# Patient Record
Sex: Female | Born: 1970 | Race: Black or African American | Hispanic: No | Marital: Married | Smoking: Never smoker
Health system: Southern US, Community
[De-identification: ages and names within clinical notes are randomized; demographics above are authoritative.]

---

## 2002-05-20 ENCOUNTER — Other Ambulatory Visit: Admission: RE | Admit: 2002-05-20 | Discharge: 2002-05-20 | Payer: Self-pay | Admitting: *Deleted

## 2002-09-13 ENCOUNTER — Inpatient Hospital Stay (HOSPITAL_COMMUNITY): Admission: AD | Admit: 2002-09-13 | Discharge: 2002-09-13 | Payer: Self-pay | Admitting: Gynecology

## 2002-10-29 ENCOUNTER — Encounter: Payer: Self-pay | Admitting: Gynecology

## 2002-10-29 ENCOUNTER — Inpatient Hospital Stay (HOSPITAL_COMMUNITY): Admission: AD | Admit: 2002-10-29 | Discharge: 2002-11-04 | Payer: Self-pay | Admitting: Gynecology

## 2002-10-31 ENCOUNTER — Encounter (INDEPENDENT_AMBULATORY_CARE_PROVIDER_SITE_OTHER): Payer: Self-pay | Admitting: Specialist

## 2002-11-05 ENCOUNTER — Encounter: Admission: RE | Admit: 2002-11-05 | Discharge: 2002-12-05 | Payer: Self-pay | Admitting: Gynecology

## 2002-12-10 ENCOUNTER — Other Ambulatory Visit: Admission: RE | Admit: 2002-12-10 | Discharge: 2002-12-10 | Payer: Self-pay | Admitting: Gynecology

## 2003-01-05 ENCOUNTER — Encounter: Admission: RE | Admit: 2003-01-05 | Discharge: 2003-02-04 | Payer: Self-pay | Admitting: Gynecology

## 2003-12-31 ENCOUNTER — Other Ambulatory Visit: Admission: RE | Admit: 2003-12-31 | Discharge: 2003-12-31 | Payer: Self-pay | Admitting: Gynecology

## 2011-01-03 ENCOUNTER — Emergency Department (HOSPITAL_BASED_OUTPATIENT_CLINIC_OR_DEPARTMENT_OTHER)
Admission: EM | Admit: 2011-01-03 | Discharge: 2011-01-04 | Payer: Self-pay | Source: Home / Self Care | Admitting: Emergency Medicine

## 2011-05-06 NOTE — Discharge Summary (Signed)
   NAMEBROOKELLE, PELLICANE                 ACCOUNT NO.:  [id number]   MEDICAL RECORD NO.:  [id number]                   PATIENT TYPE:  INP   LOCATION:  9133                                 [facility]ACILITY:  [facility]   PHYSICIAN:  Timothy P. Fontaine, M.D.           DATE OF BIRTH:  May 07, 1971   DATE OF ADMISSION:  10/29/2002  DATE OF DISCHARGE:  11/04/2002                                 DISCHARGE SUMMARY   DISCHARGE DIAGNOSES:  1. Intrauterine pregnancy 34 weeks.  2. Clinical preeclampsia.  3. Nonreassuring fetal heart rate tracing.   PROCEDURE:  Primary low cervical transverse cesarean section with delivery  of viable infant.   HISTORY OF PRESENT ILLNESS:  The patient is a 40 year old gravida 3, para 0-  0-2-0 with an LMP of March 08, 2002, [facility] December 13, 2002.  Prenatal risk  factors include history of migraines.   LABORATORIES:  Blood type A+.  Antibody screen negative.  RPR, HBSAG, HIV  nonreactive.  Sickle cell negative.   HOSPITAL COURSE:  The patient was admitted on October 29, 2002 with  elevated blood pressures and a 24 pound weight gain within two weeks.  The  patient was totally asymptomatic.  Had normal PIH panel, normal ultrasound.  She was admitted for observation, 24-hour urine collection.  Induction was  initiated on October 31, 2002 secondary to the suspicion of subclinical  preeclampsia secondary to a nonreassuring fetal heart rate tracing.  Delivery was accomplished by low cervical transverse cesarean section by Dr.  Lily Peer, assisted by Dr. Gwendalyn Ege.  Findings included a viable female  infant, Apgars 8 and 9, weight 4 pounds 15 ounces.  Arterial cord pH 7.26.  Postoperative/postpartum patient remained afebrile.  Had no difficulty  voiding.  Was able to be discharge on her third postoperative day.  CBC:  Hematocrit 26.2, hemoglobin 9.1, WBC 16, platelets 251,000.   DISPOSITION:  Follow up in six weeks.  Continue prenatal vitamins and iron.  Tylox for  pain.     Elwyn Lade . Hancock, N.P.                Timothy P. Audie Box, M.D.    MKH/MEDQ  D:  12/02/2002  T:  12/02/2002  Job:  [id number]

## 2011-05-06 NOTE — H&P (Signed)
[name], Wilson                 ACCOUNT NO.:  [id number]   MEDICAL RECORD NO.:  [id number]                 PATIENT TYPE:  MAT   LOCATION:  MATC                                 [facility]  [facility]   PHYSICIAN:  Juan H. Lily Peer, M.D.             DATE OF BIRTH:  07-Aug-1971   DATE OF ADMISSION:  10/29/2002  DATE OF DISCHARGE:                                HISTORY & PHYSICAL   CHIEF COMPLAINT:  1. Elevated blood pressure.  2. Increased weight gain.  3. Proteinuria.   HISTORY OF PRESENT ILLNESS:  The patient is a 40 year old gravida 3, para 2,  AB 2, with a last menstrual period March 08, 2002, estimated date of  confinement December 13, 2002.  The patient is currently at 33-4/7 weeks'  gestation, had presented to the office this afternoon for routine prenatal  visit and was noted to have elevated blood pressures and followup blood  pressure was 150/100, 152/102 sitting, and laterally was 130/70.  Two weeks  prior her weight was 256 pounds and on today's visit, her weight had  increased to 280 pounds.  She had 2+ proteinuria in her urine and she had 2+  pitting edema.  She denied any right upper quadrant pain, any visual  disturbances, or any headache, and she was only in the office today for her  routine prenatal visit.  Review of her record indicated that she has been  normotensive throughout her pregnancy and essentially has had an uneventful  prenatal course with the exception she has had in the past a history of  migraine headaches, but no headaches were reported today.  She was sent over  to [facility] to commence evaluation and she had the following labs  done:  Her comprehensive metabolic panel was normal with normal pregnancy  parameters.  Her LDH was within the normal range.  Uric acid was 5.9, normal  range.  Her CBC:  Hemoglobin and hematocrit 11.1, hematocrit 32.1, platelet  count 255,000.  An ultrasound was performed which demonstrated a viable  pregnancy in the vertex presentation with a grade 1 placenta.  No evidence  of a previa in the vertex presentation.  AFI was normal and estimated fetal  weight was in the 75th to 90th percentile, average of 2416 to 2657 g.  The  cervix was long at 3.1 cm and closed.  The patient was placed back on the  monitor.  She had a reactive fetal heart rate tracing.  Her blood pressures  were repeated and were as follows:  146/83, 150/90, 158/103, 153/97, 146/96,  and most recent one was 156/80 with a manual cuff.  There was quite a  difference in variation between the manual cuff and the Dinamap.  The  Dinamap on the same reading had reported 154/97.   PAST MEDICAL HISTORY:  1. No history of migraine headaches.  2. She had elective termination in 1991 and 1993.  No other medical problems     reported.  ALLERGIES:  She denies any allergies.   REVIEW OF SYSTEMS:  See [place] form.   PHYSICAL EXAMINATION:  VITAL SIGNS:  As described above.  HEENT:  Unremarkable.  NECK:  Supple.  Trachea midline.  No carotid bruits, no thyromegaly.  LUNGS:  Clear to auscultation without any rhonchi or wheezes.  HEART:  Regular rate and rhythm, no murmurs or gallops.  BREASTS:  Exam was not done.  ABDOMEN:  Gravid uterus, fundal height 34 cm, vertex presentation by Thayer Ohm  maneuver, confirmed by ultrasound.  PELVIC:  Exam was not done due to the fact that she had had an ultrasound  before the pelvic exam was initiated and demonstrated cervix on ultrasound  was 2.1 cm and closed.  EXTREMITIES:  2+ pitting edema.  DTR was 1+.   PRENATAL LABORATORY:  A positive blood type, negative antibody screen.  Sickle cell trait was negative.  VDRL was nonreactive.  Hepatitis B surface  antigen and HIV were negative.  Maternal serum alpha-fetoprotein were  normal.  Diabetes screen was normal.   ASSESSMENT:  A 40 year old gravida 3, para 0, AB 2 at 33-4/7 weeks'  gestation who was found on routine visit to have elevated  blood pressures  and had gained 24 pounds in the previous two weeks, was totally  asymptomatic.  Denied any right upper quadrant pain, any visual  disturbances, or any headache.  The patient had normal TIH panel, normal  ultrasound.  Blood pressure seems to have subsided and improved on lateral  decubitus position, but a 24 hour urine collection was initiated in the  hospital upon admission.  The patient, being 33-1/2 weeks, will receive  betamethasone 12.5 mg IM now to be repeated in 12 hours.  In the event that  her clinical picture would show evidence of deterioration such as elevated  blood pressure or abnormal labs when they are repeated in the morning, we  may need to proceed with premature delivery.  I have had a lengthy  discussion with the husband and wife, stating that if clinical condition  would deteriorate, that we may need to intervene with a cesarean section,  due to the fact that she has an unfavorable cervix this early in her  pregnancy.  The goal is to try to get two doses of the betamethasone within  12 hours and then proceed with delivery if condition deteriorates.  If not  will continue to observe closely and try to proceed with conservative  measures.  The use of hypertensive agents will be addressed if her blood  pressures do not resolve with the above mentioned measures.  Will keep her  IV line at t.k.o. and minimize fluids until she becomes n.p.o. after  midnight and we may open it up to 100 cc per hour.  The patient and her  husband are fully aware of issues concerned with hypertension, such as  preeclampsia, which could lead to stroke, abruptio placenta, fetal death, or  premature delivery.  Will continue to monitor her very closely in the  hospital and take the above mentioned actions if situations present  themselves or change.   PLAN:  As per assessment above.                                              Juan H. Lily Peer, M.D.    JHF/MEDQ  D:   10/29/2002  T:  10/29/2002  Job:  [id number]

## 2011-05-06 NOTE — Op Note (Signed)
Rachel Wilson, Rachel Wilson                 ACCOUNT NO.:  [id number]   MEDICAL RECORD NO.:  [id number]                   PATIENT TYPE:  INP   LOCATION:  9180                                 [facility]ILITY:  [facility]   PHYSICIAN:  Juan H. Lily Peer, M.D.             DATE OF BIRTH:  [birth year]   DATE OF PROCEDURE:  10/29/2002  DATE OF DISCHARGE:                                 OPERATIVE REPORT   PREOPERATIVE DIAGNOSES:  1. Thirty-four week intrauterine pregnancy.  2. Subclinical preeclampsia.  3. Nonreassuring fetal heart rate tracing.   POSTOPERATIVE DIAGNOSES:  1. Thirty-four week intrauterine pregnancy.  2. Subclinical preeclampsia.  3. Nonreassuring fetal heart rate tracing.   PROCEDURES:  Primary low uterine segment transverse cesarean section.   SURGEON:  Juan H. Lily Peer, M.D.   ASSISTANT:  Ronda Fairly. Galen Daft, M.D.   ANESTHESIA:  Epidural.   ESTIMATED BLOOD LOSS:  800 cc.   FLUIDS REPLACED:  800 cc of lactated Ringer's.   URINE OUTPUT:  125 cc and clear.   INDICATION FOR PROCEDURE:  A 40 year old gravida 3, para 2, AB 2 at 63  weeks' gestation, with subclinical preeclampsia.  Patient with 19-pound  weight gain in a 24-hour period, complaining of headaches and 1.5 g  proteinuria in a 24-hour urine collection.   FINDINGS:  Viable female infant, Apgars of 8 and 9, with a weight of 4  pounds 15 ounces, arterial cord pH of 7.26.   DESCRIPTION OF PROCEDURE:  After the patient was adequately counseled, she  was taken to the operating room, where she underwent a successful epidural  placement.  Her abdomen was prepped and draped in the usual sterile fashion.  A Foley catheter had previously been inserted in the labor and delivery  suite.  After the drapes were in place, a Pfannenstiel skin incision was  made 2 cm above the symphysis pubis, and the incision was carried down  through the skin and subcutaneous tissue down to the rectus fascia,  whereupon a midline nick was made.   The fascia was incised in a transverse  fashion, and the peritoneal cavity was entered cautiously and a bladder flap  was established.  The lower uterine segment was incised in a transverse  fashion.  Clear amniotic fluid was present.  The newborn was delivered  without any difficulty.  The nasopharyngeal area was bulb-suctioned.  The  cord was doubly clamped and excised and the newborn was shown to the parents  and passed off immediately to the pediatricians who were in attendance in  view of the above-mentioned parameters.  After the appropriate cord blood  was obtained, Pitocin was initiated.  The patient received a gram of  Cefotan.  The placenta was exteriorized and submitted to pathology for  histologic evaluation.  The intrauterine cavity was wiped free of any  products of conception, and the lower uterine segment incision was closed  with a running locking stitch of 0 Vicryl suture.  A normal maternal  pelvis  with normal tubes and ovaries.  The uterus was placed back into the pelvic  cavity and the pelvic cavity was copiously irrigated with normal saline  solution.  After inspection and ascertaining adequate hemostasis, closure  was commenced, and the needle count and sponge count were correct.  The  visceral peritoneum was not reapproximated.  The rectus fascia was closed  with a running stitch of 0 Vicryl suture, the subcutaneous bleeders were  Bovie cauterized, and the skin was reapproximated with skin clips, followed  by placement of Xeroform gauze and 4 x 4 dressing.  The patient was  transferred to the recovery room with stable vital signs.                                                Juan H. Lily Peer, M.D.    JHF/MEDQ  D:  11/01/2002  T:  11/01/2002  Job:  [id number]

## 2020-08-30 ENCOUNTER — Other Ambulatory Visit: Payer: Self-pay

## 2020-08-30 ENCOUNTER — Emergency Department (HOSPITAL_BASED_OUTPATIENT_CLINIC_OR_DEPARTMENT_OTHER): Payer: Commercial Managed Care - PPO

## 2020-08-30 ENCOUNTER — Inpatient Hospital Stay (HOSPITAL_BASED_OUTPATIENT_CLINIC_OR_DEPARTMENT_OTHER)
Admission: EM | Admit: 2020-08-30 | Discharge: 2020-09-03 | DRG: 177 | Disposition: A | Discharge status: Home / Self Care | Payer: Commercial Managed Care - PPO | Attending: Internal Medicine | Admitting: Internal Medicine

## 2020-08-30 ENCOUNTER — Encounter (HOSPITAL_BASED_OUTPATIENT_CLINIC_OR_DEPARTMENT_OTHER): Payer: Self-pay | Admitting: *Deleted

## 2020-08-30 DIAGNOSIS — J9600 Acute respiratory failure, unspecified whether with hypoxia or hypercapnia: Secondary | ICD-10-CM | POA: Diagnosis present

## 2020-08-30 DIAGNOSIS — E876 Hypokalemia: Secondary | ICD-10-CM | POA: Diagnosis present

## 2020-08-30 DIAGNOSIS — U071 COVID-19: Secondary | ICD-10-CM | POA: Diagnosis not present

## 2020-08-30 DIAGNOSIS — R0902 Hypoxemia: Secondary | ICD-10-CM | POA: Diagnosis not present

## 2020-08-30 DIAGNOSIS — G43909 Migraine, unspecified, not intractable, without status migrainosus: Secondary | ICD-10-CM

## 2020-08-30 DIAGNOSIS — J1282 Pneumonia due to coronavirus disease 2019: Secondary | ICD-10-CM | POA: Diagnosis present

## 2020-08-30 DIAGNOSIS — E669 Obesity, unspecified: Secondary | ICD-10-CM | POA: Diagnosis present

## 2020-08-30 DIAGNOSIS — J9601 Acute respiratory failure with hypoxia: Secondary | ICD-10-CM | POA: Diagnosis present

## 2020-08-30 DIAGNOSIS — Z23 Encounter for immunization: Secondary | ICD-10-CM

## 2020-08-30 DIAGNOSIS — Z79899 Other long term (current) drug therapy: Secondary | ICD-10-CM

## 2020-08-30 DIAGNOSIS — Z6836 Body mass index (BMI) 36.0-36.9, adult: Secondary | ICD-10-CM

## 2020-08-30 LAB — FERRITIN: Ferritin: 39 ng/mL (ref 11–307)

## 2020-08-30 LAB — COMPREHENSIVE METABOLIC PANEL
ALT: 38 U/L (ref 0–44)
AST: 46 U/L — ABNORMAL HIGH (ref 15–41)
Albumin: 2.8 g/dL — ABNORMAL LOW (ref 3.5–5.0)
Alkaline Phosphatase: 60 U/L (ref 38–126)
Anion gap: 14 (ref 5–15)
BUN: 6 mg/dL (ref 6–20)
CO2: 29 mmol/L (ref 22–32)
Calcium: 8.5 mg/dL — ABNORMAL LOW (ref 8.9–10.3)
Chloride: 93 mmol/L — ABNORMAL LOW (ref 98–111)
Creatinine, Ser: 0.78 mg/dL (ref 0.44–1.00)
GFR calc Af Amer: >60 mL/min (ref >60)
GFR calc non Af Amer: >60 mL/min (ref >60)
Glucose, Bld: 115 mg/dL — ABNORMAL HIGH (ref 70–99)
Potassium: 3.3 mmol/L — ABNORMAL LOW (ref 3.5–5.1)
Sodium: 136 mmol/L (ref 135–145)
Total Bilirubin: 0.9 mg/dL (ref 0.3–1.2)
Total Protein: 7.7 g/dL (ref 6.5–8.1)

## 2020-08-30 LAB — PREGNANCY, URINE: Preg Test, Ur: NEGATIVE

## 2020-08-30 LAB — SARS CORONAVIRUS 2 BY RT PCR (HOSPITAL ORDER, PERFORMED IN ~~LOC~~ HOSPITAL LAB): SARS Coronavirus 2: POSITIVE — AB

## 2020-08-30 LAB — CBC WITH DIFFERENTIAL/PLATELET
Abs Immature Granulocytes: 0.17 10*3/uL — ABNORMAL HIGH (ref 0.00–0.07)
Basophils Absolute: 0 10*3/uL (ref 0.0–0.1)
Basophils Relative: 0 %
Eosinophils Absolute: 0 10*3/uL (ref 0.0–0.5)
Eosinophils Relative: 0 %
HCT: 32.3 % — ABNORMAL LOW (ref 36.0–46.0)
Hemoglobin: 9.8 g/dL — ABNORMAL LOW (ref 12.0–15.0)
Immature Granulocytes: 2 %
Lymphocytes Relative: 11 %
Lymphs Abs: 0.8 10*3/uL (ref 0.7–4.0)
MCH: 25.3 pg — ABNORMAL LOW (ref 26.0–34.0)
MCHC: 30.3 g/dL (ref 30.0–36.0)
MCV: 83.2 fL (ref 80.0–100.0)
Monocytes Absolute: 0.3 10*3/uL (ref 0.1–1.0)
Monocytes Relative: 4 %
Neutro Abs: 6.6 10*3/uL (ref 1.7–7.7)
Neutrophils Relative %: 83 %
Platelets: 419 10*3/uL — ABNORMAL HIGH (ref 150–400)
RBC: 3.88 MIL/uL (ref 3.87–5.11)
RDW: 16.5 % — ABNORMAL HIGH (ref 11.5–15.5)
WBC: 7.9 10*3/uL (ref 4.0–10.5)
nRBC: 1.5 % — ABNORMAL HIGH (ref 0.0–0.2)

## 2020-08-30 LAB — PROCALCITONIN: Procalcitonin: 0.13 ng/mL

## 2020-08-30 LAB — D-DIMER, QUANTITATIVE: D-Dimer, Quant: 12.26 ug/mL-FEU — ABNORMAL HIGH (ref 0.00–0.50)

## 2020-08-30 LAB — C-REACTIVE PROTEIN: CRP: 37.1 mg/dL — ABNORMAL HIGH (ref <1.0)

## 2020-08-30 LAB — LACTATE DEHYDROGENASE: LDH: 482 U/L — ABNORMAL HIGH (ref 98–192)

## 2020-08-30 LAB — LACTIC ACID, PLASMA: Lactic Acid, Venous: 1.8 mmol/L (ref 0.5–1.9)

## 2020-08-30 LAB — FIBRINOGEN: Fibrinogen: >800 mg/dL — ABNORMAL HIGH (ref 210–475)

## 2020-08-30 MED ORDER — SODIUM CHLORIDE 0.9 % IV SOLN
100.0000 mg | INTRAVENOUS | Status: AC
  Administered 2020-08-30 (×2): 100 mg via INTRAVENOUS
  Filled 2020-08-30 (×2): qty 20

## 2020-08-30 MED ORDER — ACETAMINOPHEN 325 MG PO TABS
650.0000 mg | ORAL_TABLET | Freq: Once | ORAL | Status: AC | PRN
  Administered 2020-08-30: 650 mg via ORAL
  Filled 2020-08-30: qty 2

## 2020-08-30 MED ORDER — DEXAMETHASONE SODIUM PHOSPHATE 10 MG/ML IJ SOLN
6.0000 mg | Freq: Once | INTRAMUSCULAR | Status: AC
  Administered 2020-08-30: 6 mg via INTRAVENOUS
  Filled 2020-08-30: qty 1

## 2020-08-30 MED ORDER — POTASSIUM CHLORIDE CRYS ER 20 MEQ PO TBCR
40.0000 meq | EXTENDED_RELEASE_TABLET | Freq: Once | ORAL | Status: AC
  Administered 2020-08-30: 40 meq via ORAL
  Filled 2020-08-30: qty 2

## 2020-08-30 MED ORDER — ACETAMINOPHEN 325 MG PO TABS
325.0000 mg | ORAL_TABLET | Freq: Once | ORAL | Status: AC
  Administered 2020-08-30: 325 mg via ORAL
  Filled 2020-08-30: qty 1

## 2020-08-30 MED ORDER — SODIUM CHLORIDE 0.9 % IV SOLN
INTRAVENOUS | Status: DC | PRN
  Administered 2020-08-30: 250 mL via INTRAVENOUS

## 2020-08-30 MED ORDER — SODIUM CHLORIDE 0.9 % IV SOLN
100.0000 mg | Freq: Every day | INTRAVENOUS | Status: AC
  Administered 2020-08-31 – 2020-09-03 (×4): 100 mg via INTRAVENOUS
  Filled 2020-08-30 (×4): qty 20

## 2020-08-30 MED ORDER — IOHEXOL 350 MG/ML SOLN
100.0000 mL | Freq: Once | INTRAVENOUS | Status: AC | PRN
  Administered 2020-08-30: 100 mL via INTRAVENOUS

## 2020-08-30 NOTE — ED Provider Notes (Addendum)
[facility] EMERGENCY DEPARTMENT Provider Note   CSN: [id number] Arrival date & time: 08/30/20  1854     History Chief Complaint  Patient presents with  . Covid Positive    Rachel Wilson is a 49 y.o. female.  HPI Patient is a 49 year old female who presents with a multitude of symptoms consistent with COVID-19.  Patient states that she was exposed to someone with COVID-19 on September 6.  Her symptoms started shortly thereafter.  Patient states she tested positive last week.  She has not been vaccinated for COVID-19.  She is experiencing waxing and waning diffuse headaches, fevers, chills, body aches, cough, rhinorrhea, sore throat.  She reports associated worsening shortness of breath.  No chest pain.  Denies any abdominal pain, nausea, vomiting, diarrhea.  No syncope.    History reviewed. No pertinent past medical history.  There are no problems to display for this patient.   History reviewed. No pertinent surgical history.   OB History   No obstetric history on file.     History reviewed. No pertinent family history.  Social History   Tobacco Use  . Smoking status: Never Smoker  . Smokeless tobacco: Never Used  Substance Use Topics  . Alcohol use: Never  . Drug use: Never    Home Medications Prior to Admission medications   Not on File    Allergies    Patient has no known allergies.  Review of Systems   Review of Systems  All other systems reviewed and are negative. Ten systems reviewed and are negative for acute change, except as noted in the HPI.   Physical Exam Updated Vital Signs BP (!) 134/102   Pulse (!) 124   Temp (!) 101.1 F (38.4 C) (Oral)   Resp 18   Ht 5\' 7"  (1.702 m)   Wt 104.3 kg   LMP 07/26/2020   SpO2 (!) 88%   BMI 36.02 kg/m   Physical Exam Vitals and nursing note reviewed.  Constitutional:      General: She is in acute distress.     Appearance: Normal appearance. She is obese. She is ill-appearing. She is not  toxic-appearing or diaphoretic.  HENT:     Head: Normocephalic and atraumatic.     Right Ear: External ear normal.     Left Ear: External ear normal.     Nose: Nose normal.     Mouth/Throat:     Mouth: Mucous membranes are moist.     Pharynx: Oropharynx is clear. No oropharyngeal exudate or posterior oropharyngeal erythema.  Eyes:     Extraocular Movements: Extraocular movements intact.  Cardiovascular:     Rate and Rhythm: Regular rhythm. Tachycardia present.     Pulses: Normal pulses.     Heart sounds: Normal heart sounds. No murmur heard.  No friction rub. No gallop.      Comments: No murmurs, rubs, gallops.  Patient is tachycardic around 130 bpm. Pulmonary:     Effort: Pulmonary effort is normal. No respiratory distress.     Breath sounds: No stridor. Rales present. No wheezing or rhonchi.     Comments: Crackles noted in the bilateral lung bases.  Oxygen saturations dropped to 82% on room air.  Patient was placed on 2 L of O2 via nasal cannula with saturations in the low to mid 90s. Chest:     Chest wall: No tenderness.  Abdominal:     General: Abdomen is flat.     Palpations: Abdomen is soft.  Tenderness: There is no abdominal tenderness.  Musculoskeletal:        General: Normal range of motion.     Cervical back: Normal range of motion and neck supple. No tenderness.  Skin:    General: Skin is warm and dry.  Neurological:     General: No focal deficit present.     Mental Status: She is alert and oriented to person, place, and time.  Psychiatric:        Mood and Affect: Mood normal.        Behavior: Behavior normal.    ED Results / Procedures / Treatments   Labs (all labs ordered are listed, but only abnormal results are displayed) Labs Reviewed  SARS CORONAVIRUS 2 BY RT PCR (HOSPITAL ORDER, PERFORMED IN [facility]) - Abnormal; Notable for the following components:      Result Value   SARS Coronavirus 2 POSITIVE (*)    All other components within  normal limits  CBC WITH DIFFERENTIAL/PLATELET - Abnormal; Notable for the following components:   Hemoglobin 9.8 (*)    HCT 32.3 (*)    MCH 25.3 (*)    RDW 16.5 (*)    Platelets 419 (*)    nRBC 1.5 (*)    Abs Immature Granulocytes 0.17 (*)    All other components within normal limits  COMPREHENSIVE METABOLIC PANEL - Abnormal; Notable for the following components:   Potassium 3.3 (*)    Chloride 93 (*)    Glucose, Bld 115 (*)    Calcium 8.5 (*)    Albumin 2.8 (*)    AST 46 (*)    All other components within normal limits  D-DIMER, QUANTITATIVE (NOT AT [facility]) - Abnormal; Notable for the following components:   D-Dimer, Quant 12.26 (*)    All other components within normal limits  LACTATE DEHYDROGENASE - Abnormal; Notable for the following components:   LDH 482 (*)    All other components within normal limits  FIBRINOGEN - Abnormal; Notable for the following components:   Fibrinogen >800 (*)    All other components within normal limits  C-REACTIVE PROTEIN - Abnormal; Notable for the following components:   CRP 37.1 (*)    All other components within normal limits  CULTURE, BLOOD (ROUTINE X 2)  CULTURE, BLOOD (ROUTINE X 2)  LACTIC ACID, PLASMA  PROCALCITONIN  FERRITIN  LACTIC ACID, PLASMA  TRIGLYCERIDES  PREGNANCY, URINE   EKG None  Radiology CT Angio Chest PE W and/or Wo Contrast  Result Date: 08/30/2020 CLINICAL DATA:  COVID, short of breath tachycardia EXAM: CT ANGIOGRAPHY CHEST WITH CONTRAST TECHNIQUE: Multidetector CT imaging of the chest was performed using the standard protocol during bolus administration of intravenous contrast. Multiplanar CT image reconstructions and MIPs were obtained to evaluate the vascular anatomy. CONTRAST:  OMNIPAQUE IOHEXOL 350 MG/ML SOLN COMPARISON:  None. FINDINGS: Cardiovascular: Satisfactory opacification of the pulmonary arteries to the segmental level. No evidence of pulmonary embolism. Normal heart size. No pericardial effusion.  Nonaneurysmal aorta. No dissection is seen. Mediastinum/Nodes: Midline trachea. No suspicious thyroid mass. Mild mediastinal adenopathy. Left jugular node measures 11 mm. Prevascular lymph node measuring up to 15 mm. Left precarinal node measures 10 mm. Small hilar nodes. Esophagus within normal limits. Lungs/Pleura: Widespread bilateral ground-glass densities and patchy consolidations. No pleural effusion or pneumothorax Upper Abdomen: No acute abnormality. Musculoskeletal: No chest wall abnormality. No acute or significant osseous findings. Review of the MIP images confirms the above findings. IMPRESSION: 1. Negative for acute  pulmonary embolus or aortic dissection. 2. Widespread bilateral ground-glass densities and patchy consolidations, consistent with bilateral pneumonia and likely related to history of COVID positivity. 3. Mild mediastinal and hilar adenopathy, likely reactive. Electronically Signed   By: Jasmine Pang M.D.   On: 08/30/2020 21:22   DG Chest Port 1 View  Result Date: 08/30/2020 CLINICAL DATA:  COVID positive with shortness of breath. EXAM: PORTABLE CHEST 1 VIEW COMPARISON:  None. FINDINGS: Mild ill-defined multifocal infiltrates are seen within the bilateral lung bases and along the periphery of both lungs. There is no evidence of a pleural effusion or pneumothorax. The heart size and mediastinal contours are within normal limits. The visualized skeletal structures are unremarkable. IMPRESSION: Mild bilateral multifocal infiltrates. Electronically Signed   By: Aram Candela M.D.   On: 08/30/2020 20:27    Procedures .Critical Care Performed by: Placido Sou, PA-C Authorized by: Placido Sou, PA-C   Critical care provider statement:    Critical care time (minutes):  45   Critical care was necessary to treat or prevent imminent or life-threatening deterioration of the following conditions:  Respiratory failure   Critical care was time spent personally by me on the  following activities:  Discussions with consultants, evaluation of patient's response to treatment, examination of patient, ordering and performing treatments and interventions, ordering and review of laboratory studies, ordering and review of radiographic studies, pulse oximetry, re-evaluation of patient's condition, obtaining history from patient or surrogate and review of old charts   (including critical care time)  Medications Ordered in ED Medications  iohexol (OMNIPAQUE) 350 MG/ML injection 100 mL (has no administration in time range)  acetaminophen (TYLENOL) tablet 325 mg (has no administration in time range)  potassium chloride SA (KLOR-CON) CR tablet 40 mEq (has no administration in time range)  acetaminophen (TYLENOL) tablet 650 mg (650 mg Oral Given 08/30/20 1925)    ED Course  I have reviewed the triage vital signs and the nursing notes.  Pertinent labs & imaging results that were available during my care of the patient were reviewed by me and considered in my medical decision making (see chart for details).  Clinical Course as of Aug 31 930  Wynelle Link Aug 30, 2020  2041 Mild bilateral multifocal infiltrates  DG Chest [facility] [LJ]  2052 Hemoglobin(!): 9.8 [LJ]  2052 Platelets(!): 419 [LJ]  2052 Will replete with Klor-Con.  Potassium(!): 3.3 [LJ]  2052 SARS Coronavirus 2(!): POSITIVE [LJ]  2206 Spoke to [facility] hospitalist regarding admission for this patient.  Requested that I put in an order for Remdesivir per pharmacy as well as dexamethasone.  We will do so.   [LJ]    Clinical Course User Index [LJ] Placido Sou, PA-C   MDM Rules/Calculators/A&P                          Pt is a 49 y.o. female that presents with a history, physical exam, and ED Clinical Course as noted above.   Patient presents today due to increasing shortness of breath secondary to a known COVID-19 infection.  Patient once again tested positive for COVID-19.  Patient states she started to  experience her symptoms on September 6.  She has not been vaccinated for COVID-19.  No leukocytosis noted on CBC.  Hemoglobin down to 9.8.  Significantly elevated D-dimer at 12.26.  Given her tachycardia and hypoxia I ordered a PE study.  This was negative for acute pulmonary embolism.  Mildly hypokalemic at  3.3 which was repleted with Klor-Con.  Elevated fibrinogen, LDH, CRP.  She was initially tachycardic near 140 bpm with a fever of 101.1 Fahrenheit.  She was hypoxic down to 82% on room air.  She has been given 975 mg of Tylenol as well as placed on 2 L of O2 via nasal cannula.  O2 saturations are currently fluctuating in the mid 90s.  Tachycardia has improved to 100 to 110 bpm.  Discussed with the hospitalist team for admission.  Hospitalist asked that I start patient on Remdesivir as well as dexamethasone.  These were both ordered.  Note: Portions of this report may have been transcribed using voice recognition software. Every effort was made to ensure accuracy; however, inadvertent computerized transcription errors may be present.   Final Clinical Impression(s) / ED Diagnoses Final diagnoses:  COVID-19  Hypoxia   Rx / DC Orders ED Discharge Orders    None       Placido SouJoldersma, Lorance Pickeral, PA-C 08/30/20 2301    Placido SouJoldersma, Marlaya Turck, PA-C 08/31/20 0932    Placido SouJoldersma, Shunte Senseney, PA-C 08/31/20 0934    Charlynne PanderYao, David Hsienta, MD 08/31/20 [phone]

## 2020-08-30 NOTE — ED Notes (Signed)
Lab called a covid positive patient. [place], [place] aware.

## 2020-08-30 NOTE — ED Triage Notes (Signed)
Pt reports she is covid positive-dx . Reports migraine and cough continues.

## 2020-08-31 DIAGNOSIS — Z79899 Other long term (current) drug therapy: Secondary | ICD-10-CM | POA: Diagnosis not present

## 2020-08-31 DIAGNOSIS — U071 COVID-19: Principal | ICD-10-CM

## 2020-08-31 DIAGNOSIS — Z6836 Body mass index (BMI) 36.0-36.9, adult: Secondary | ICD-10-CM | POA: Diagnosis not present

## 2020-08-31 DIAGNOSIS — J1282 Pneumonia due to coronavirus disease 2019: Secondary | ICD-10-CM | POA: Diagnosis present

## 2020-08-31 DIAGNOSIS — G43009 Migraine without aura, not intractable, without status migrainosus: Secondary | ICD-10-CM | POA: Diagnosis not present

## 2020-08-31 DIAGNOSIS — J9600 Acute respiratory failure, unspecified whether with hypoxia or hypercapnia: Secondary | ICD-10-CM | POA: Diagnosis present

## 2020-08-31 DIAGNOSIS — E876 Hypokalemia: Secondary | ICD-10-CM | POA: Diagnosis present

## 2020-08-31 DIAGNOSIS — R0902 Hypoxemia: Secondary | ICD-10-CM | POA: Diagnosis present

## 2020-08-31 DIAGNOSIS — G43909 Migraine, unspecified, not intractable, without status migrainosus: Secondary | ICD-10-CM

## 2020-08-31 DIAGNOSIS — J9601 Acute respiratory failure with hypoxia: Secondary | ICD-10-CM | POA: Diagnosis present

## 2020-08-31 DIAGNOSIS — E669 Obesity, unspecified: Secondary | ICD-10-CM | POA: Diagnosis present

## 2020-08-31 DIAGNOSIS — Z23 Encounter for immunization: Secondary | ICD-10-CM | POA: Diagnosis not present

## 2020-08-31 LAB — TRIGLYCERIDES: Triglycerides: 158 mg/dL — ABNORMAL HIGH (ref <150)

## 2020-08-31 MED ORDER — HYDROCOD POLST-CPM POLST ER 10-8 MG/5ML PO SUER
5.0000 mL | Freq: Two times a day (BID) | ORAL | Status: DC | PRN
  Administered 2020-08-31 – 2020-09-02 (×2): 5 mL via ORAL
  Filled 2020-08-31 (×2): qty 5

## 2020-08-31 MED ORDER — DEXAMETHASONE SODIUM PHOSPHATE 10 MG/ML IJ SOLN
6.0000 mg | Freq: Once | INTRAMUSCULAR | Status: AC
  Administered 2020-08-31: 6 mg via INTRAVENOUS
  Filled 2020-08-31: qty 1

## 2020-08-31 MED ORDER — ACETAMINOPHEN 325 MG PO TABS
650.0000 mg | ORAL_TABLET | Freq: Four times a day (QID) | ORAL | Status: DC | PRN
  Administered 2020-09-02: 650 mg via ORAL
  Filled 2020-08-31: qty 2

## 2020-08-31 MED ORDER — INFLUENZA VAC SPLIT QUAD 0.5 ML IM SUSY
0.5000 mL | PREFILLED_SYRINGE | INTRAMUSCULAR | Status: DC
  Filled 2020-08-31: qty 0.5

## 2020-08-31 MED ORDER — SUMATRIPTAN SUCCINATE 50 MG PO TABS: 100.0000 mg | ORAL_TABLET | ORAL | Status: DC | PRN

## 2020-08-31 MED ORDER — ASCORBIC ACID 500 MG PO TABS
500.0000 mg | ORAL_TABLET | Freq: Every day | ORAL | Status: DC
  Administered 2020-08-31 – 2020-09-03 (×4): 500 mg via ORAL
  Filled 2020-08-31 (×4): qty 1

## 2020-08-31 MED ORDER — SODIUM CHLORIDE 0.9 % IV SOLN: 250.0000 mL | INTRAVENOUS | Status: DC | PRN

## 2020-08-31 MED ORDER — SODIUM CHLORIDE 0.9 % IV SOLN: 200.0000 mg | Freq: Once | INTRAVENOUS | Status: DC

## 2020-08-31 MED ORDER — SODIUM CHLORIDE 0.9% FLUSH
3.0000 mL | Freq: Two times a day (BID) | INTRAVENOUS | Status: DC
  Administered 2020-08-31 – 2020-09-03 (×6): 3 mL via INTRAVENOUS

## 2020-08-31 MED ORDER — ONDANSETRON HCL 4 MG PO TABS: 4.0000 mg | ORAL_TABLET | Freq: Four times a day (QID) | ORAL | Status: DC | PRN

## 2020-08-31 MED ORDER — POTASSIUM CHLORIDE CRYS ER 20 MEQ PO TBCR
40.0000 meq | EXTENDED_RELEASE_TABLET | Freq: Once | ORAL | Status: AC
  Administered 2020-08-31: 40 meq via ORAL
  Filled 2020-08-31: qty 2

## 2020-08-31 MED ORDER — ENOXAPARIN SODIUM 60 MG/0.6ML ~~LOC~~ SOLN
50.0000 mg | SUBCUTANEOUS | Status: DC
  Administered 2020-08-31: 50 mg via SUBCUTANEOUS
  Filled 2020-08-31: qty 0.6

## 2020-08-31 MED ORDER — DEXAMETHASONE SODIUM PHOSPHATE 10 MG/ML IJ SOLN
6.0000 mg | Freq: Every day | INTRAMUSCULAR | Status: DC
  Administered 2020-09-01: 6 mg via INTRAVENOUS
  Filled 2020-08-31: qty 1

## 2020-08-31 MED ORDER — ZINC SULFATE 220 (50 ZN) MG PO CAPS
220.0000 mg | ORAL_CAPSULE | Freq: Every day | ORAL | Status: DC
  Administered 2020-08-31 – 2020-09-03 (×4): 220 mg via ORAL
  Filled 2020-08-31 (×4): qty 1

## 2020-08-31 MED ORDER — SODIUM CHLORIDE 0.9% FLUSH: 3.0000 mL | INTRAVENOUS | Status: DC | PRN

## 2020-08-31 MED ORDER — SUMATRIPTAN SUCCINATE 50 MG PO TABS
100.0000 mg | ORAL_TABLET | ORAL | Status: DC | PRN
  Filled 2020-08-31: qty 2

## 2020-08-31 MED ORDER — SODIUM CHLORIDE 0.9 % IV SOLN: 100.0000 mg | Freq: Every day | INTRAVENOUS | Status: DC

## 2020-08-31 MED ORDER — ACETAMINOPHEN 500 MG PO TABS: 1000.0000 mg | ORAL_TABLET | Freq: Four times a day (QID) | ORAL | Status: DC | PRN

## 2020-08-31 MED ORDER — ALBUTEROL SULFATE HFA 108 (90 BASE) MCG/ACT IN AERS: 2.0000 | INHALATION_SPRAY | Freq: Four times a day (QID) | RESPIRATORY_TRACT | Status: DC | PRN

## 2020-08-31 MED ORDER — ONDANSETRON HCL 4 MG/2ML IJ SOLN: 4.0000 mg | Freq: Four times a day (QID) | INTRAMUSCULAR | Status: DC | PRN

## 2020-08-31 NOTE — H&P (Addendum)
History and Physical    Rachel Wilson SAY:[id number] DOB: [birth year] DOA: 08/30/2020  PCP: Patient, No Pcp Per   Patient coming from: Home  I have personally briefly reviewed patient's old medical records in [facility]  Chief Complaint: Shortness of breath  HPI: Rachel Wilson is a 49 y.o. female with no significant past medical history who presents to the emergency room for evaluation of cough and shortness of breath.  Patient states that she was exposed to someone with COVID-19 viral infection on September 6  but tested positive for the COVID-19 virus on 08/30/20.  She presents with complaints of headache, fever, chills, myalgias, cough, sore throat and shortness of breath.  She denies having any chest pain, no abdominal pain, no nausea, no vomiting or diarrhea. Patient is unvaccinated In the ER patient was noted to be tachycardic with heart rate of 140 bpm, she was febrile with T-max of 101 and was hypoxic with room air pulse oximetry of 82%.  Patient was placed on 2 L of oxygen with improvement in her pulse oximetry to the mid 90s. Labs reveal sodium of 136, potassium 3.3, chloride 93, bicarb 29, BUN 6, creatinine 0.78, calcium 8.5, AST 46, ALT 38, LDH 482, triglycerides 158, ferritin 39, CRP 37, lactic acid 1.8, procalcitonin 0.13, white cell count 7.9, hemoglobin 9.8, hematocrit 32.3, RDW 16.5, D-dimer 12.26, fibrinogen greater than 800. Chest x-ray reviewed by me showed mild bilateral multifocal infiltrates. CT angiogram of the chest is negative for acute pulmonary embolism but shows widespread bilateral groundglass densities and patchy consolidations consistent with bilateral pneumonia.  Mild mediastinal and hilar adenopathy likely reactive. Twelve-lead EKG reveals sinus tachycardia   ED Course: Patient is a 49 year old unvaccinated female who presented to the ER for evaluation of multiple symptoms which include headaches, fever, cough and shortness of breath.  She was noted to be  hypoxic with room air pulse oximetry of 82% that improved with oxygen supplementation at 2 L.  She had a fever with a T-max of 101 and was tachycardic with heart rate of 140. Chest x-ray showed bilateral multifocal infiltrates Patient will be admitted to the hospital for further evaluation.  Review of Systems: As per HPI otherwise 10 point review of systems negative.    History reviewed. No pertinent past medical history.  History reviewed. No pertinent surgical history.   reports that she has never smoked. She has never used smokeless tobacco. She reports that she does not drink alcohol and does not use drugs.  No Known Allergies  History reviewed. No pertinent family history.   Prior to Admission medications   Medication Sig Start Date End Date Taking? Authorizing Provider  acetaminophen (TYLENOL) 500 MG tablet Take 1,000 mg by mouth every 6 (six) hours as needed for headache (pain).   Yes [provider]  albuterol (VENTOLIN HFA) 108 (90 Base) MCG/ACT inhaler Inhale 2 puffs into the lungs every 6 (six) hours as needed for wheezing or shortness of breath.  08/26/20  Yes [provider]  chlorpheniramine-HYDROcodone (TUSSIONEX) 10-8 MG/5ML SUER Take 5 mLs by mouth every 12 (twelve) hours as needed for cough.  08/26/20  Yes [provider]  MAGNESIUM PO Take 1 tablet by mouth daily.   Yes [provider]  Multiple Vitamin (MULTIVITAMIN WITH MINERALS) TABS tablet Take 1 tablet by mouth daily.   Yes [provider]  Multiple Vitamins-Minerals (ZINC PO) Take 1 tablet by mouth daily.   Yes [provider]  rizatriptan (MAXALT) 10 MG tablet  Take 10 mg by mouth 2 (two) times daily as needed for migraine.  08/26/20  Yes [provider]  tacrolimus (PROTOPIC) 0.1 % ointment Apply 1 application topically 2 (two) times daily as needed (facial breakouts).  03/18/20  Yes [provider]    Physical Exam: Vitals:   08/31/20 0848  08/31/20 1047 08/31/20 1327 08/31/20 1425  BP: 132/88 (!) 135/96 135/86 (!) 132/95  Pulse: 100 100 (!) 116 (!) 103  Resp: (!) 23 (!) 23 20 19   Temp: 98.2 F (36.8 C) 98.3 F (36.8 C) 98.5 F (36.9 C) 98.7 F (37.1 C)  TempSrc: Oral Oral Oral   SpO2: 93% 94% 92% 94%  Weight:      Height:         Vitals:   08/31/20 0848 08/31/20 1047 08/31/20 1327 08/31/20 1425  BP: 132/88 (!) 135/96 135/86 (!) 132/95  Pulse: 100 100 (!) 116 (!) 103  Resp: (!) 23 (!) 23 20 19   Temp: 98.2 F (36.8 C) 98.3 F (36.8 C) 98.5 F (36.9 C) 98.7 F (37.1 C)  TempSrc: Oral Oral Oral   SpO2: 93% 94% 92% 94%  Weight:      Height:        Constitutional: NAD, alert and oriented x 3.  Acutely ill-appearing Eyes: PERRL, lids and conjunctivae pallor ENMT: Mucous membranes are moist.  Neck: normal, supple, no masses, no thyromegaly Respiratory: Crackles at the bases, no wheezing, no crackles. Normal respiratory effort. No accessory muscle use.  Cardiovascular: Tachycardic, no murmurs / rubs / gallops. No extremity edema. 2+ pedal pulses. No carotid bruits.  Abdomen: no tenderness, no masses palpated. No hepatosplenomegaly. Bowel sounds positive.  Musculoskeletal: no clubbing / cyanosis. No joint deformity upper and lower extremities.  Skin: no rashes, lesions, ulcers.  Neurologic: No gross focal neurologic deficit. Psychiatric: Normal mood and affect.   Labs on Admission: I have personally reviewed following labs and imaging studies  CBC: Recent Labs  Lab 08/30/20 1944  WBC 7.9  NEUTROABS 6.6  HGB 9.8*  HCT 32.3*  MCV 83.2  PLT 419*   Basic Metabolic Panel: Recent Labs  Lab 08/30/20 1944  NA 136  K 3.3*  CL 93*  CO2 29  GLUCOSE 115*  BUN 6  CREATININE 0.78  CALCIUM 8.5*   GFR: Estimated Creatinine Clearance: 109.2 mL/min (by C-G formula based on SCr of 0.78 mg/dL). Liver Function Tests: Recent Labs  Lab 08/30/20 1944  AST 46*  ALT 38  ALKPHOS 60  BILITOT 0.9  PROT 7.7    ALBUMIN 2.8*   No results for input(s): LIPASE, AMYLASE in the last 168 hours. No results for input(s): AMMONIA in the last 168 hours. Coagulation Profile: No results for input(s): INR, PROTIME in the last 168 hours. Cardiac Enzymes: No results for input(s): CKTOTAL, CKMB, CKMBINDEX, TROPONINI in the last 168 hours. BNP (last 3 results) No results for input(s): PROBNP in the last 8760 hours. HbA1C: No results for input(s): HGBA1C in the last 72 hours. CBG: No results for input(s): GLUCAP in the last 168 hours. Lipid Profile: Recent Labs    08/30/20 1944  TRIG 158*   Thyroid Function Tests: No results for input(s): TSH, T4TOTAL, FREET4, T3FREE, THYROIDAB in the last 72 hours. Anemia Panel: Recent Labs    08/30/20 1944  FERRITIN 39   Urine analysis: No results found for: COLORURINE, APPEARANCEUR, LABSPEC, PHURINE, GLUCOSEU, HGBUR, BILIRUBINUR, KETONESUR, PROTEINUR, UROBILINOGEN, NITRITE, LEUKOCYTESUR  Radiological Exams on Admission: CT Angio Chest PE W and/or  Wo Contrast  Result Date: 08/30/2020 CLINICAL DATA:  COVID, short of breath tachycardia EXAM: CT ANGIOGRAPHY CHEST WITH CONTRAST TECHNIQUE: Multidetector CT imaging of the chest was performed using the standard protocol during bolus administration of intravenous contrast. Multiplanar CT image reconstructions and MIPs were obtained to evaluate the vascular anatomy. CONTRAST:  OMNIPAQUE IOHEXOL 350 MG/ML SOLN COMPARISON:  None. FINDINGS: Cardiovascular: Satisfactory opacification of the pulmonary arteries to the segmental level. No evidence of pulmonary embolism. Normal heart size. No pericardial effusion. Nonaneurysmal aorta. No dissection is seen. Mediastinum/Nodes: Midline trachea. No suspicious thyroid mass. Mild mediastinal adenopathy. Left jugular node measures 11 mm. Prevascular lymph node measuring up to 15 mm. Left precarinal node measures 10 mm. Small hilar nodes. Esophagus within normal limits. Lungs/Pleura:  Widespread bilateral ground-glass densities and patchy consolidations. No pleural effusion or pneumothorax Upper Abdomen: No acute abnormality. Musculoskeletal: No chest wall abnormality. No acute or significant osseous findings. Review of the MIP images confirms the above findings. IMPRESSION: 1. Negative for acute pulmonary embolus or aortic dissection. 2. Widespread bilateral ground-glass densities and patchy consolidations, consistent with bilateral pneumonia and likely related to history of COVID positivity. 3. Mild mediastinal and hilar adenopathy, likely reactive. Electronically Signed   By: Jasmine Pang M.D.   On: 08/30/2020 21:22   DG Chest Port 1 View  Result Date: 08/30/2020 CLINICAL DATA:  COVID positive with shortness of breath. EXAM: PORTABLE CHEST 1 VIEW COMPARISON:  None. FINDINGS: Mild ill-defined multifocal infiltrates are seen within the bilateral lung bases and along the periphery of both lungs. There is no evidence of a pleural effusion or pneumothorax. The heart size and mediastinal contours are within normal limits. The visualized skeletal structures are unremarkable. IMPRESSION: Mild bilateral multifocal infiltrates. Electronically Signed   By: Aram Candela M.D.   On: 08/30/2020 20:27    EKG: Independently reviewed.  Sinus tachycardia   Assessment/Plan Principal Problem:   Pneumonia due to COVID-19 virus Active Problems:   Acute respiratory failure due to COVID-19 [facility])   Migraine headache    Pneumonia due to COVID-19 virus with acute respiratory failure Patient had an exposure to someone with a COVID-19 virus around September 6 and presents to the ER for evaluation of headaches, fever, chills, nonproductive cough and shortness of breath. Patient is unvaccinated and tested positive for the COVID-19 virus on 08/30/20 Patient was hypoxic in the field with room air pulse oximetry of 82% and this improved on 2 L of oxygen to the mid 90s Chest x-ray shows bilateral  focal infiltrates We will start patient on remdesivir per protocol and Decadron Continue oxygen supplementation to maintain pulse oximetry greater than 92% Supportive care with antitussives and bronchodilator therapy as needed   Migraine headaches Continue as needed triptans   Hypokalemia Supplement potassium   DVT prophylaxis: Lovenox Code Status: Full code Family Communication: Greater than 50% of time spent discussing plan of care with patient at the bedside.  All questions and concerns have been addressed.  She verbalizes understanding and agrees with the plan. Disposition Plan: Back to previous home environment Consults called: None    Delana Manganello MD [facility]     08/31/2020, 4:33 PM

## 2020-08-31 NOTE — ED Notes (Signed)
Pt up to use BSC.

## 2020-08-31 NOTE — Progress Notes (Signed)
Flutter valve not given to pt due to product being on back-order. RT management aware.

## 2020-08-31 NOTE — ED Notes (Signed)
Pt repositioned in bed  Has no needs at this time bsc emptied

## 2020-09-01 ENCOUNTER — Inpatient Hospital Stay (HOSPITAL_COMMUNITY): Payer: Commercial Managed Care - PPO

## 2020-09-01 DIAGNOSIS — R7989 Other specified abnormal findings of blood chemistry: Secondary | ICD-10-CM

## 2020-09-01 LAB — CBC WITH DIFFERENTIAL/PLATELET
Abs Immature Granulocytes: 0.28 10*3/uL — ABNORMAL HIGH (ref 0.00–0.07)
Basophils Absolute: 0 10*3/uL (ref 0.0–0.1)
Basophils Relative: 0 %
Eosinophils Absolute: 0 10*3/uL (ref 0.0–0.5)
Eosinophils Relative: 0 %
HCT: 31.1 % — ABNORMAL LOW (ref 36.0–46.0)
Hemoglobin: 9.1 g/dL — ABNORMAL LOW (ref 12.0–15.0)
Immature Granulocytes: 2 %
Lymphocytes Relative: 8 %
Lymphs Abs: 1.1 10*3/uL (ref 0.7–4.0)
MCH: 25.4 pg — ABNORMAL LOW (ref 26.0–34.0)
MCHC: 29.3 g/dL — ABNORMAL LOW (ref 30.0–36.0)
MCV: 86.9 fL (ref 80.0–100.0)
Monocytes Absolute: 0.7 10*3/uL (ref 0.1–1.0)
Monocytes Relative: 5 %
Neutro Abs: 11.8 10*3/uL — ABNORMAL HIGH (ref 1.7–7.7)
Neutrophils Relative %: 85 %
Platelets: 386 10*3/uL (ref 150–400)
RBC: 3.58 MIL/uL — ABNORMAL LOW (ref 3.87–5.11)
RDW: 16.9 % — ABNORMAL HIGH (ref 11.5–15.5)
WBC: 14 10*3/uL — ABNORMAL HIGH (ref 4.0–10.5)
nRBC: 1.7 % — ABNORMAL HIGH (ref 0.0–0.2)

## 2020-09-01 LAB — COMPREHENSIVE METABOLIC PANEL
ALT: 29 U/L (ref 0–44)
AST: 27 U/L (ref 15–41)
Albumin: 2.6 g/dL — ABNORMAL LOW (ref 3.5–5.0)
Alkaline Phosphatase: 55 U/L (ref 38–126)
Anion gap: 16 — ABNORMAL HIGH (ref 5–15)
BUN: 16 mg/dL (ref 6–20)
CO2: 24 mmol/L (ref 22–32)
Calcium: 8.9 mg/dL (ref 8.9–10.3)
Chloride: 100 mmol/L (ref 98–111)
Creatinine, Ser: 0.67 mg/dL (ref 0.44–1.00)
GFR calc Af Amer: >60 mL/min (ref >60)
GFR calc non Af Amer: >60 mL/min (ref >60)
Glucose, Bld: 123 mg/dL — ABNORMAL HIGH (ref 70–99)
Potassium: 4 mmol/L (ref 3.5–5.1)
Sodium: 140 mmol/L (ref 135–145)
Total Bilirubin: 0.3 mg/dL (ref 0.3–1.2)
Total Protein: 6.6 g/dL (ref 6.5–8.1)

## 2020-09-01 LAB — D-DIMER, QUANTITATIVE: D-Dimer, Quant: >20 ug/mL-FEU — ABNORMAL HIGH (ref 0.00–0.50)

## 2020-09-01 LAB — PHOSPHORUS: Phosphorus: 2.9 mg/dL (ref 2.5–4.6)

## 2020-09-01 LAB — C-REACTIVE PROTEIN: CRP: 21.68 mg/dL — ABNORMAL HIGH (ref <1.0)

## 2020-09-01 LAB — HIV ANTIBODY (ROUTINE TESTING W REFLEX): HIV Screen 4th Generation wRfx: NONREACTIVE

## 2020-09-01 LAB — MAGNESIUM: Magnesium: 2.5 mg/dL — ABNORMAL HIGH (ref 1.7–2.4)

## 2020-09-01 LAB — FERRITIN: Ferritin: 44 ng/mL (ref 11–307)

## 2020-09-01 MED ORDER — METHYLPREDNISOLONE SODIUM SUCC 125 MG IJ SOLR
60.0000 mg | Freq: Two times a day (BID) | INTRAMUSCULAR | Status: DC
  Administered 2020-09-01 – 2020-09-03 (×4): 60 mg via INTRAVENOUS
  Filled 2020-09-01 (×4): qty 2

## 2020-09-01 MED ORDER — ENOXAPARIN SODIUM 100 MG/ML ~~LOC~~ SOLN
100.0000 mg | Freq: Two times a day (BID) | SUBCUTANEOUS | Status: DC
  Administered 2020-09-01 – 2020-09-03 (×5): 100 mg via SUBCUTANEOUS
  Filled 2020-09-01 (×4): qty 1

## 2020-09-01 NOTE — Progress Notes (Signed)
Pt did not tolerate walking to the BR  In her room without Oxygen. Remained on Cont pulse ox with O2 sats down to low 80s & HR elevated to 120's. Once returned to bed and placed O2 on - all returned to baseline within a few minutes. Will use BSC for the night

## 2020-09-01 NOTE — Progress Notes (Signed)
ANTICOAGULATION CONSULT NOTE - Initial Consult  Pharmacy Consult for Lovenox Indication: COVID+ w/ d-dimer > 20  No Known Allergies  Patient Measurements: Height: 5\' 7"  (170.2 cm) Weight: 104.3 kg (230 lb) IBW/kg (Calculated) : 61.6  Vital Signs: Temp: 98.1 F (36.7 C) (09/14 0548) BP: 146/87 (09/14 0548) Pulse Rate: 79 (09/14 0548)  Labs: Recent Labs    08/30/20 1944 09/01/20 0546  HGB 9.8* 9.1*  HCT 32.3* 31.1*  PLT 419* 386  CREATININE 0.78 0.67    Estimated Creatinine Clearance: 109.2 mL/min (by C-G formula based on SCr of 0.67 mg/dL).   Medical History: History reviewed. No pertinent past medical history.  Medications:  Previously on Lovenox 50 mg daily for VTE ppx with last dose last PM  Assessment:  49 y/o F admitted for COVID PNA ordered with significantly elevated d-dimer to initiate full-dose Lovenox.   Goal of Therapy:  Anti-Xa level 0.6-1 units/ml 4hrs after LMWH dose given Monitor platelets by anticoagulation protocol: Yes   Plan:  Lovenox 1 mg/kg q 12 hours (100 mg q 12h) - Will f/u renal function, CBC, d-dimer, imaging  - Will sign off note writing for now  Thank you for the consult  49 09/01/2020,12:13 PM

## 2020-09-01 NOTE — Progress Notes (Signed)
Lower extremity venous bilateral study completed.   Please see CV Proc for preliminary results.   Rachel Wilson  

## 2020-09-01 NOTE — Plan of Care (Signed)
  Problem: Nutrition: Goal: Adequate nutrition will be maintained Outcome: Progressing   Problem: Elimination: Goal: Will not experience complications related to bowel motility Outcome: Progressing   Problem: Safety: Goal: Ability to remain free from injury will improve Outcome: Progressing   

## 2020-09-01 NOTE — Progress Notes (Signed)
PROGRESS NOTE    Rachel BourgeoisLatoya Double  ZOX:[id number] DOB: 11/21/1971 DOA: 08/30/2020 PCP: Patient, No Pcp Per    Brief Narrative:  Rachel Wilson is a 49 year old female with no significant past medical history who presented to the emergency department for progressive cough and shortness of breath.  Patient notes Covid-19 exposure on 08/24/2020 with positive Covid-19 test on 08/30/2020.  Patient also endorses headache, fever, chills, myalgias, sore throat.  In the ED, patient was noted to be tachycardic with HR 140 bpm, febrile with temperature 101.0 and hypoxic on room air with SPO2 82% on room air.  Patient was placed on 2 L of supplemental oxygen with improvement of her SPO2 to mid 90s.  X-ray notable for multifocal infiltrates.  D-dimer elevated 12.26, procalcitonin 0.13, CRP 37, fibrinogen greater than 800.  CT angiogram chest negative for pulmonary embolism but with widespread bilateral groundglass opacities consistent with multifocal pneumonia.  Patient was started on remdesivir and steroids.  TRH consulted for admission.   Assessment & Plan:   Principal Problem:   Pneumonia due to COVID-19 virus Active Problems:   Acute respiratory failure due to COVID-19 [facility](HCC)   Migraine headache   Hypokalemia   Acute hypoxic respiratory failure secondary to acute Covid-19 viral pneumonia during the ongoing 2020/2021 Covid 19 Pandemic - POA Patient presenting from home with progressive shortness of breath and hypoxia.  Covid-19 known exposure on 08/24/2020 with positive PCR 08/30/2020.  Patient was noted to be febrile, tachycardic, tachypneic, and hypoxic on presentation. --COVID test: + 08/30/2020 --CRP 37.1>21.68 --ddimer 12.26, >20.00 --Remdesivir, plan 5-day course (Day #3/5) --Change Decadron to Solumedrol 60 mg IV q12h --prone for 2-3hrs every 12hrs if able --Continue supplemental oxygen, titrate to maintain SPO2 greater than 92%, currently on 2 L nasal cannula with SPO2 97% --Continue supportive  care with albuterol MDI prn, vitamin C, zinc, Tylenol, antitussives (benzonatate/ Mucinex/Tussionex) --Follow CBC, CMP, D-dimer, ferritin, and CRP daily --Continue airborne/contact isolation precautions for 3 weeks from the day of diagnosis  The treatment plan and use of medications and known side effects were discussed with patient/family. Some of the medications used are based on case reports/anecdotal data.  All other medications being used in the management of COVID-19 based on limited study data.  Complete risks and long-term side effects are unknown, however in the best clinical judgment they seem to be of some benefit.  Patient wanted to proceed with treatment options provided.  Elevated D-dimer D-dimer on presentation 12.26, now increased to greater than 20.  CT angiogram chest negative for PE. --Start treatment dose Lovenox --Check vascular duplex ultrasound bilateral lower extremities --Follow D-dimer daily   DVT prophylaxis: Lovenox Code Status: Full code Family Communication: Updated patient extensively at bedside  Disposition Plan:  Status is: Inpatient  Remains inpatient appropriate because:Ongoing diagnostic testing needed not appropriate for outpatient work up, Unsafe d/c plan, IV treatments appropriate due to intensity of illness or inability to take PO and Inpatient level of care appropriate due to severity of illness   Dispo: The patient is from: Home              Anticipated d/c is to: Home              Anticipated d/c date is: 3 days              Patient currently is not medically stable to d/c.    Consultants:   None  Procedures:   None  Antimicrobials:   None   Subjective: Patient  seen and examined bedside, resting comfortably.  Lying in bed.  Continues with dyspnea at rest that is worse with any type of exertion.  Feels breathing is slightly improved since yesterday.  Continues on 2 L nasal cannula with continued desaturation while ambulating to  restroom per nursing staff.  No other complaints or concerns at this time.  Denies headache, no visual changes, no chest pain, palpitations, no abdominal pain.  No acute events overnight per nursing staff.  Objective: Vitals:   08/31/20 1800 08/31/20 2248 09/01/20 0333 09/01/20 0548  BP: 137/86 (!) 153/97 (!) 149/97 (!) 146/87  Pulse: (!) 102 92 86 79  Resp: 19 17 (!) 24 17  Temp: 98.3 F (36.8 C) 98.4 F (36.9 C) 98.1 F (36.7 C) 98.1 F (36.7 C)  TempSrc: Oral     SpO2: 94% 97% 96% 97%  Weight:      Height:        Intake/Output Summary (Last 24 hours) at 09/01/2020 1208 Last data filed at 09/01/2020 0900 Gross per 24 hour  Intake 1090.82 ml  Output --  Net 1090.82 ml   Filed Weights   08/30/20 1903  Weight: 104.3 kg    Examination:  General exam: Appears calm and comfortable  Respiratory system: Breath sounds slightly decreased bilateral bases, otherwise clear, no wheezes/crackles, normal respiratory effort, on 2 L nasal cannula with SPO2 90% at rest Cardiovascular system: S1 & S2 heard, RRR. No JVD, murmurs, rubs, gallops or clicks. No pedal edema. Gastrointestinal system: Abdomen is nondistended, soft and nontender. No organomegaly or masses felt. Normal bowel sounds heard. Central nervous system: Alert and oriented. No focal neurological deficits. Extremities: Symmetric 5 x 5 power. Skin: No rashes, lesions or ulcers Psychiatry: Judgement and insight appear normal. Mood & affect appropriate.     Data Reviewed: I have personally reviewed following labs and imaging studies  CBC: Recent Labs  Lab 08/30/20 1944 09/01/20 0546  WBC 7.9 14.0*  NEUTROABS 6.6 11.8*  HGB 9.8* 9.1*  HCT 32.3* 31.1*  MCV 83.2 86.9  PLT 419* 386   Basic Metabolic Panel: Recent Labs  Lab 08/30/20 1944 09/01/20 0546  NA 136 140  K 3.3* 4.0  CL 93* 100  CO2 29 24  GLUCOSE 115* 123*  BUN 6 16  CREATININE 0.78 0.67  CALCIUM 8.5* 8.9  MG  --  2.5*  PHOS  --  2.9    GFR: Estimated Creatinine Clearance: 109.2 mL/min (by C-G formula based on SCr of 0.67 mg/dL). Liver Function Tests: Recent Labs  Lab 08/30/20 1944 09/01/20 0546  AST 46* 27  ALT 38 29  ALKPHOS 60 55  BILITOT 0.9 0.3  PROT 7.7 6.6  ALBUMIN 2.8* 2.6*   No results for input(s): LIPASE, AMYLASE in the last 168 hours. No results for input(s): AMMONIA in the last 168 hours. Coagulation Profile: No results for input(s): INR, PROTIME in the last 168 hours. Cardiac Enzymes: No results for input(s): CKTOTAL, CKMB, CKMBINDEX, TROPONINI in the last 168 hours. BNP (last 3 results) No results for input(s): PROBNP in the last 8760 hours. HbA1C: No results for input(s): HGBA1C in the last 72 hours. CBG: No results for input(s): GLUCAP in the last 168 hours. Lipid Profile: Recent Labs    08/30/20 1944  TRIG 158*   Thyroid Function Tests: No results for input(s): TSH, T4TOTAL, FREET4, T3FREE, THYROIDAB in the last 72 hours. Anemia Panel: Recent Labs    08/30/20 1944 09/01/20 0546  FERRITIN 39 44   Sepsis  Labs: Recent Labs  Lab 08/30/20 1944  PROCALCITON 0.13  LATICACIDVEN 1.8    Recent Results (from the past 240 hour(s))  Blood Culture (routine x 2)     Status: None (Preliminary result)   Collection Time: 08/30/20  7:44 PM   Specimen: BLOOD  Result Value Ref Range Status   Specimen Description   Final    BLOOD RIGHT ANTECUBITAL Performed at [facility], [address], [place], [place] [address]    Special Requests   Final    BOTTLES DRAWN AEROBIC AND ANAEROBIC Blood Culture adequate volume Performed at [facility], [address] [place], [place] [address]    Culture   Final    NO GROWTH 1 DAY Performed at [facility], [address], [place], [place] [address]    Report Status PENDING  Incomplete  SARS Coronavirus 2 by RT PCR (hospital order, performed in [facility] hospital lab) Nasopharyngeal Nasopharyngeal Swab     Status:  Abnormal   Collection Time: 08/30/20  8:02 PM   Specimen: Nasopharyngeal Swab  Result Value Ref Range Status   SARS Coronavirus 2 POSITIVE (A) NEGATIVE Final    Comment: RESULT CALLED TO, READ BACK BY AND VERIFIED WITH: Garnette Czech, RN AT 2127 ON [id number] BY BOWLBY, J (NOTE) SARS-CoV-2 target nucleic acids are DETECTED  SARS-CoV-2 RNA is generally detectable in upper respiratory specimens  during the acute phase of infection.  Positive results are indicative  of the presence of the identified virus, but do not rule out bacterial infection or co-infection with other pathogens not detected by the test.  Clinical correlation with patient history and  other diagnostic information is necessary to determine patient infection status.  The expected result is negative.  Fact Sheet for Patients:   BoilerBrush.com.cy   Fact Sheet for Healthcare Providers:   https://pope.com/    This test is not yet approved or cleared by the [place] FDA and  has been authorized for detection and/or diagnosis of SARS-CoV-2 by FDA under an Emergency Use Authorization (EUA).  This EUA will remain in effect (me aning this test can be used) for the duration of  the COVID-19 declaration under Section 564(b)(1) of the Act, 21 U.S.C. section 360-bbb-3(b)(1), unless the authorization is terminated or revoked sooner.  Performed at [facility], [address] [place], [place] [address]   Blood Culture (routine x 2)     Status: None (Preliminary result)   Collection Time: 08/30/20  8:05 PM   Specimen: BLOOD RIGHT HAND  Result Value Ref Range Status   Specimen Description   Final    BLOOD RIGHT HAND Performed at [facility], [address] [place], [place] [address]    Special Requests   Final    BOTTLES DRAWN AEROBIC ONLY Blood Culture results may not be optimal due to an inadequate volume of blood received in culture bottles Performed at  [facility], [address] [place], [place] [address]    Culture   Final    NO GROWTH 1 DAY Performed at [facility], [address], [place], [place] [address]    Report Status PENDING  Incomplete         Radiology Studies: CT Angio Chest PE W and/or Wo Contrast  Result Date: 08/30/2020 CLINICAL DATA:  COVID, short of breath tachycardia EXAM: CT ANGIOGRAPHY CHEST WITH CONTRAST TECHNIQUE: Multidetector CT imaging of the chest was  performed using the standard protocol during bolus administration of intravenous contrast. Multiplanar CT image reconstructions and MIPs were obtained to evaluate the vascular anatomy. CONTRAST:  OMNIPAQUE IOHEXOL 350 MG/ML SOLN COMPARISON:  None. FINDINGS: Cardiovascular: Satisfactory opacification of the pulmonary arteries to the segmental level. No evidence of pulmonary embolism. Normal heart size. No pericardial effusion. Nonaneurysmal aorta. No dissection is seen. Mediastinum/Nodes: Midline trachea. No suspicious thyroid mass. Mild mediastinal adenopathy. Left jugular node measures 11 mm. Prevascular lymph node measuring up to 15 mm. Left precarinal node measures 10 mm. Small hilar nodes. Esophagus within normal limits. Lungs/Pleura: Widespread bilateral ground-glass densities and patchy consolidations. No pleural effusion or pneumothorax Upper Abdomen: No acute abnormality. Musculoskeletal: No chest wall abnormality. No acute or significant osseous findings. Review of the MIP images confirms the above findings. IMPRESSION: 1. Negative for acute pulmonary embolus or aortic dissection. 2. Widespread bilateral ground-glass densities and patchy consolidations, consistent with bilateral pneumonia and likely related to history of COVID positivity. 3. Mild mediastinal and hilar adenopathy, likely reactive. Electronically Signed   By: Jasmine Pang M.D.   On: 08/30/2020 21:22   DG Chest Port 1 View  Result Date: 08/30/2020 CLINICAL DATA:  COVID  positive with shortness of breath. EXAM: PORTABLE CHEST 1 VIEW COMPARISON:  None. FINDINGS: Mild ill-defined multifocal infiltrates are seen within the bilateral lung bases and along the periphery of both lungs. There is no evidence of a pleural effusion or pneumothorax. The heart size and mediastinal contours are within normal limits. The visualized skeletal structures are unremarkable. IMPRESSION: Mild bilateral multifocal infiltrates. Electronically Signed   By: Aram Candela M.D.   On: 08/30/2020 20:27        Scheduled Meds: . vitamin C  500 mg Oral Daily  . dexamethasone (DECADRON) injection  6 mg Intravenous Daily  . enoxaparin (LOVENOX) injection  50 mg Subcutaneous Q24H  . influenza vac split quadrivalent PF  0.5 mL Intramuscular Tomorrow-1000  . sodium chloride flush  3 mL Intravenous Q12H  . zinc sulfate  220 mg Oral Daily   Continuous Infusions: . sodium chloride 250 mL (08/30/20 2316)  . sodium chloride    . remdesivir 100 mg in NS 100 mL 100 mg (09/01/20 1138)     LOS: 1 day    Time spent: 38 minutes spent on chart review, discussion with nursing staff, consultants, updating family and interview/physical exam; more than 50% of that time was spent in counseling and/or coordination of care.    Alvira Philips [place], DO [facility] Available via Epic secure chat 7am-7pm After these hours, please refer to coverage provider listed on [email or web address] 09/01/2020, 12:08 PM

## 2020-09-02 LAB — COMPREHENSIVE METABOLIC PANEL
ALT: 33 U/L (ref 0–44)
AST: 26 U/L (ref 15–41)
Albumin: 2.7 g/dL — ABNORMAL LOW (ref 3.5–5.0)
Alkaline Phosphatase: 54 U/L (ref 38–126)
Anion gap: 9 (ref 5–15)
BUN: 19 mg/dL (ref 6–20)
CO2: 25 mmol/L (ref 22–32)
Calcium: 8.8 mg/dL — ABNORMAL LOW (ref 8.9–10.3)
Chloride: 105 mmol/L (ref 98–111)
Creatinine, Ser: 0.78 mg/dL (ref 0.44–1.00)
GFR calc Af Amer: >60 mL/min (ref >60)
GFR calc non Af Amer: >60 mL/min (ref >60)
Glucose, Bld: 144 mg/dL — ABNORMAL HIGH (ref 70–99)
Potassium: 4 mmol/L (ref 3.5–5.1)
Sodium: 139 mmol/L (ref 135–145)
Total Bilirubin: 0.6 mg/dL (ref 0.3–1.2)
Total Protein: 6.8 g/dL (ref 6.5–8.1)

## 2020-09-02 LAB — CBC WITH DIFFERENTIAL/PLATELET
Abs Immature Granulocytes: 0.41 10*3/uL — ABNORMAL HIGH (ref 0.00–0.07)
Basophils Absolute: 0 10*3/uL (ref 0.0–0.1)
Basophils Relative: 0 %
Eosinophils Absolute: 0 10*3/uL (ref 0.0–0.5)
Eosinophils Relative: 0 %
HCT: 30.7 % — ABNORMAL LOW (ref 36.0–46.0)
Hemoglobin: 9.2 g/dL — ABNORMAL LOW (ref 12.0–15.0)
Immature Granulocytes: 2 %
Lymphocytes Relative: 8 %
Lymphs Abs: 1.3 10*3/uL (ref 0.7–4.0)
MCH: 25.8 pg — ABNORMAL LOW (ref 26.0–34.0)
MCHC: 30 g/dL (ref 30.0–36.0)
MCV: 86.2 fL (ref 80.0–100.0)
Monocytes Absolute: 0.7 10*3/uL (ref 0.1–1.0)
Monocytes Relative: 4 %
Neutro Abs: 14.7 10*3/uL — ABNORMAL HIGH (ref 1.7–7.7)
Neutrophils Relative %: 86 %
Platelets: 465 10*3/uL — ABNORMAL HIGH (ref 150–400)
RBC: 3.56 MIL/uL — ABNORMAL LOW (ref 3.87–5.11)
RDW: 17.2 % — ABNORMAL HIGH (ref 11.5–15.5)
WBC: 17.2 10*3/uL — ABNORMAL HIGH (ref 4.0–10.5)
nRBC: 0.9 % — ABNORMAL HIGH (ref 0.0–0.2)

## 2020-09-02 LAB — PHOSPHORUS: Phosphorus: 4.2 mg/dL (ref 2.5–4.6)

## 2020-09-02 LAB — MAGNESIUM: Magnesium: 2 mg/dL (ref 1.7–2.4)

## 2020-09-02 LAB — FERRITIN: Ferritin: 33 ng/mL (ref 11–307)

## 2020-09-02 LAB — D-DIMER, QUANTITATIVE: D-Dimer, Quant: 8.16 ug/mL-FEU — ABNORMAL HIGH (ref 0.00–0.50)

## 2020-09-02 LAB — C-REACTIVE PROTEIN: CRP: 10.2 mg/dL — ABNORMAL HIGH (ref <1.0)

## 2020-09-02 MED ORDER — HYDRALAZINE HCL 25 MG PO TABS: 25.0000 mg | ORAL_TABLET | Freq: Four times a day (QID) | ORAL | Status: DC | PRN

## 2020-09-02 NOTE — Progress Notes (Signed)
PROGRESS NOTE    Rachel Wilson  FVC:[id number] DOB: 03-14-1971 DOA: 08/30/2020 PCP: Patient, No Pcp Per    Brief Narrative:  49 year old female with no significant past medical history presented to the ER with progressive cough and shortness of breath. Tested positive for COVID-19 on 9/12.  Unvaccinated. In the emergency room, tachycardic with heart rate 140s, temperature 101, 82% on room air.  X-ray with multifocal infiltrates.  D-dimer 12.26.  CTA negative for PE.   Assessment & Plan:   Principal Problem:   Pneumonia due to COVID-19 virus Active Problems:   Acute respiratory failure due to COVID-19 [facility])   Migraine headache   Hypokalemia  Acute hypoxemic respiratory failure due to COVID-19 infection/pneumonia due to COVID-19 virus: Continue to monitor due to significant symptoms  chest physiotherapy, incentive spirometry, deep breathing exercises, sputum induction, mucolytic's and bronchodilators. Supplemental oxygen to keep saturations more than 90%. Covid directed therapy with , steroids, remains on Solu-Medrol high-dose. remdesivir, day 4/5 Due to severity of symptoms, patient will need daily inflammatory markers, chest x-rays, liver function test to monitor and direct COVID-19 therapies. CTA on 9/12 - for PE. Duplexes on 9/14 - for bilateral DVT. Patient on therapeutic Lovenox because of D-dimer more than 20.  COVID-19 Labs  Recent Labs    08/30/20 1944 09/01/20 0546 09/02/20 0428  DDIMER 12.26* >20.00* 8.16*  FERRITIN 39 44 33  LDH 482*  --   --   CRP 37.1* 21.68* 10.2*    Lab Results  Component Value Date   SARSCOV2NAA POSITIVE (A) 08/30/2020   SpO2: 99 % O2 Flow Rate (L/min): 2 L/min FiO2 (%): 87 %    DVT prophylaxis: Lovenox subcu   Code Status: Full code Family Communication: Patient talking with family Disposition Plan: Status is: Inpatient  Remains inpatient appropriate because:Inpatient level of care appropriate due to severity of  illness   Dispo:  Patient From: Home  Planned Disposition: Home  Expected discharge date: 09/03/20  Medically stable for discharge: No          Consultants:   None  Procedures:   None  Antimicrobials:  Antibiotics Given (last 72 hours)    Date/Time Action Medication Dose Rate   08/30/20 2320 New Bag/Given   remdesivir 100 mg in sodium chloride 0.9 % 100 mL IVPB 100 mg 200 mL/hr   08/30/20 2357 New Bag/Given   remdesivir 100 mg in sodium chloride 0.9 % 100 mL IVPB 100 mg 200 mL/hr   08/31/20 1109 New Bag/Given   remdesivir 100 mg in sodium chloride 0.9 % 100 mL IVPB 100 mg 200 mL/hr   09/01/20 1138 New Bag/Given   remdesivir 100 mg in sodium chloride 0.9 % 100 mL IVPB 100 mg 200 mL/hr   09/02/20 1046 New Bag/Given   remdesivir 100 mg in sodium chloride 0.9 % 100 mL IVPB 100 mg 200 mL/hr         Subjective: Patient was seen and examined.  No overnight events.  Remains on 2 L of oxygen.  Difficulty to take deep breathing otherwise other symptoms are improving.  Has not mobilized much.  Objective: Vitals:   09/01/20 0548 09/01/20 1344 09/01/20 1954 09/02/20 0406  BP: (!) 146/87 (!) 156/99 134/90 (!) 143/91  Pulse: 79 98 90 76  Resp: 17 19 20 20   Temp: 98.1 F (36.7 C) 97.9 F (36.6 C) 98 F (36.7 C) 98 F (36.7 C)  TempSrc:  Oral    SpO2: 97% 96% 97% 99%  Weight:  Height:        Intake/Output Summary (Last 24 hours) at 09/02/2020 1452 Last data filed at 09/01/2020 1900 Gross per 24 hour  Intake 352.04 ml  Output --  Net 352.04 ml   Filed Weights   08/30/20 1903  Weight: 104.3 kg    Examination:  General exam: Appears calm and comfortable, mildly anxious and short of breath on ambulation. Respiratory system: Clear to auscultation. Respiratory effort normal.  No added sounds. Cardiovascular system: S1 & S2 heard, RRR. No JVD, murmurs, rubs, gallops or clicks. No pedal edema. Gastrointestinal system: Abdomen is nondistended, soft and  nontender. No organomegaly or masses felt. Normal bowel sounds heard. Central nervous system: Alert and oriented. No focal neurological deficits.     Data Reviewed: I have personally reviewed following labs and imaging studies  CBC: Recent Labs  Lab 08/30/20 1944 09/01/20 0546 09/02/20 0428  WBC 7.9 14.0* 17.2*  NEUTROABS 6.6 11.8* 14.7*  HGB 9.8* 9.1* 9.2*  HCT 32.3* 31.1* 30.7*  MCV 83.2 86.9 86.2  PLT 419* 386 465*   Basic Metabolic Panel: Recent Labs  Lab 08/30/20 1944 09/01/20 0546 09/02/20 0428  NA 136 140 139  K 3.3* 4.0 4.0  CL 93* 100 105  CO2 29 24 25   GLUCOSE 115* 123* 144*  BUN 6 16 19   CREATININE 0.78 0.67 0.78  CALCIUM 8.5* 8.9 8.8*  MG  --  2.5* 2.0  PHOS  --  2.9 4.2   GFR: Estimated Creatinine Clearance: 109.2 mL/min (by C-G formula based on SCr of 0.78 mg/dL). Liver Function Tests: Recent Labs  Lab 08/30/20 1944 09/01/20 0546 09/02/20 0428  AST 46* 27 26  ALT 38 29 33  ALKPHOS 60 55 54  BILITOT 0.9 0.3 0.6  PROT 7.7 6.6 6.8  ALBUMIN 2.8* 2.6* 2.7*   No results for input(s): LIPASE, AMYLASE in the last 168 hours. No results for input(s): AMMONIA in the last 168 hours. Coagulation Profile: No results for input(s): INR, PROTIME in the last 168 hours. Cardiac Enzymes: No results for input(s): CKTOTAL, CKMB, CKMBINDEX, TROPONINI in the last 168 hours. BNP (last 3 results) No results for input(s): PROBNP in the last 8760 hours. HbA1C: No results for input(s): HGBA1C in the last 72 hours. CBG: No results for input(s): GLUCAP in the last 168 hours. Lipid Profile: Recent Labs    08/30/20 1944  TRIG 158*   Thyroid Function Tests: No results for input(s): TSH, T4TOTAL, FREET4, T3FREE, THYROIDAB in the last 72 hours. Anemia Panel: Recent Labs    09/01/20 0546 09/02/20 0428  FERRITIN 44 33   Sepsis Labs: Recent Labs  Lab 08/30/20 1944  PROCALCITON 0.13  LATICACIDVEN 1.8    Recent Results (from the past 240 hour(s))  Blood  Culture (routine x 2)     Status: None (Preliminary result)   Collection Time: 08/30/20  7:44 PM   Specimen: BLOOD  Result Value Ref Range Status   Specimen Description   Final    BLOOD RIGHT ANTECUBITAL Performed at [facility], [address] [place], [address] [place]    Special Requests   Final    BOTTLES DRAWN AEROBIC AND ANAEROBIC Blood Culture adequate volume Performed at [facility], [address] [place], [address] [place]    Culture   Final    NO GROWTH 2 DAYS Performed at [facility], [address], [place], [address] [place]    Report Status PENDING  Incomplete  SARS  Coronavirus 2 by RT PCR (hospital order, performed in [facility] hospital lab) Nasopharyngeal Nasopharyngeal Swab     Status: Abnormal   Collection Time: 08/30/20  8:02 PM   Specimen: Nasopharyngeal Swab  Result Value Ref Range Status   SARS Coronavirus 2 POSITIVE (A) NEGATIVE Final    Comment: RESULT CALLED TO, READ BACK BY AND VERIFIED WITH: Garnette CzechMAYNARD, C, RN AT 2127 ON [id number] BY BOWLBY, J (NOTE) SARS-CoV-2 target nucleic acids are DETECTED  SARS-CoV-2 RNA is generally detectable in upper respiratory specimens  during the acute phase of infection.  Positive results are indicative  of the presence of the identified virus, but do not rule out bacterial infection or co-infection with other pathogens not detected by the test.  Clinical correlation with patient history and  other diagnostic information is necessary to determine patient infection status.  The expected result is negative.  Fact Sheet for Patients:   BoilerBrush.com.cyhttps://www.fda.gov/media/136312/download   Fact Sheet for Healthcare Providers:   https://pope.com/https://www.fda.gov/media/136313/download    This test is not yet approved or cleared by the [place] FDA and  has been authorized for detection and/or diagnosis of SARS-CoV-2 by FDA under an Emergency Use Authorization (EUA).  This EUA will remain in effect (me aning this  test can be used) for the duration of  the COVID-19 declaration under Section 564(b)(1) of the Act, 21 U.S.C. section 360-bbb-3(b)(1), unless the authorization is terminated or revoked sooner.  Performed at [facility] [place], [address] [place], [place] [address]   Blood Culture (routine x 2)     Status: None (Preliminary result)   Collection Time: 08/30/20  8:05 PM   Specimen: BLOOD RIGHT HAND  Result Value Ref Range Status   Specimen Description   Final    BLOOD RIGHT HAND Performed at [facility], [address] [place], [place] [address]    Special Requests   Final    BOTTLES DRAWN AEROBIC ONLY Blood Culture results may not be optimal due to an inadequate volume of blood received in culture bottles Performed at [facility] [place], [address] [place], [place] [address]    Culture   Final    NO GROWTH 2 DAYS Performed at [facility], [address] [place], [place] [address]    Report Status PENDING  Incomplete         Radiology Studies: VAS US LOWER EXTREMITY VENOUS (DVT)  Result Date: 09/01/2020  Lower Venous DVTStudy Indications: Elevated d-dimer.  Anticoagulation: Lovenox. Comparison Study: No prior studies. Performing Technologist: Jean Rosenthalachel Hodge  Examination Guidelines: A complete evaluation includes B-mode imaging, spectral Doppler, color Doppler, and power Doppler as needed of all accessible portions of each vessel. Bilateral testing is considered an integral part of a complete examination. Limited examinations for reoccurring indications may be performed as noted. The reflux portion of the exam is performed with the patient in reverse Trendelenburg.  +---------+---------------+---------+-----------+----------+--------------+ RIGHT    CompressibilityPhasicitySpontaneityPropertiesThrombus Aging +---------+---------------+---------+-----------+----------+--------------+ CFV      Full           Yes      Yes                                  +---------+---------------+---------+-----------+----------+--------------+ SFJ      Full                                                        +---------+---------------+---------+-----------+----------+--------------+  FV Prox  Full                                                        +---------+---------------+---------+-----------+----------+--------------+ FV Mid   Full                                                        +---------+---------------+---------+-----------+----------+--------------+ FV DistalFull                                                        +---------+---------------+---------+-----------+----------+--------------+ PFV      Full                                                        +---------+---------------+---------+-----------+----------+--------------+ POP      Full           Yes      Yes                                 +---------+---------------+---------+-----------+----------+--------------+ PTV      Full                                                        +---------+---------------+---------+-----------+----------+--------------+ PERO     Full                                                        +---------+---------------+---------+-----------+----------+--------------+   +---------+---------------+---------+-----------+----------+--------------+ LEFT     CompressibilityPhasicitySpontaneityPropertiesThrombus Aging +---------+---------------+---------+-----------+----------+--------------+ CFV      Full           Yes      Yes                                 +---------+---------------+---------+-----------+----------+--------------+ SFJ      Full                                                        +---------+---------------+---------+-----------+----------+--------------+ FV Prox  Full                                                         +---------+---------------+---------+-----------+----------+--------------+  FV Mid   Full                                                        +---------+---------------+---------+-----------+----------+--------------+ FV DistalFull                                                        +---------+---------------+---------+-----------+----------+--------------+ PFV      Full                                                        +---------+---------------+---------+-----------+----------+--------------+ POP      Full           Yes      Yes                                 +---------+---------------+---------+-----------+----------+--------------+ PTV      Full                                                        +---------+---------------+---------+-----------+----------+--------------+ PERO     Full                                                        +---------+---------------+---------+-----------+----------+--------------+     Summary: RIGHT: - There is no evidence of deep vein thrombosis in the lower extremity.  - No cystic structure found in the popliteal fossa.  LEFT: - There is no evidence of deep vein thrombosis in the lower extremity.  - No cystic structure found in the popliteal fossa.  *See table(s) above for measurements and observations. Electronically signed by Gretta Began MD on 09/01/2020 at 4:58:53 PM.    Final         Scheduled Meds: . vitamin C  500 mg Oral Daily  . enoxaparin (LOVENOX) injection  100 mg Subcutaneous Q12H  . influenza vac split quadrivalent PF  0.5 mL Intramuscular Tomorrow-1000  . methylPREDNISolone (SOLU-MEDROL) injection  60 mg Intravenous Q12H  . sodium chloride flush  3 mL Intravenous Q12H  . zinc sulfate  220 mg Oral Daily   Continuous Infusions: . sodium chloride 250 mL (08/30/20 2316)  . sodium chloride    . remdesivir 100 mg in NS 100 mL 100 mg (09/02/20 1046)     LOS: 2 days    Time spent: 30  minutes    Dorcas Carrow, MD [facility] Pager [phone]

## 2020-09-03 LAB — CBC WITH DIFFERENTIAL/PLATELET
Abs Immature Granulocytes: 0.51 10*3/uL — ABNORMAL HIGH (ref 0.00–0.07)
Basophils Absolute: 0 10*3/uL (ref 0.0–0.1)
Basophils Relative: 0 %
Eosinophils Absolute: 0 10*3/uL (ref 0.0–0.5)
Eosinophils Relative: 0 %
HCT: 32.3 % — ABNORMAL LOW (ref 36.0–46.0)
Hemoglobin: 9.4 g/dL — ABNORMAL LOW (ref 12.0–15.0)
Immature Granulocytes: 3 %
Lymphocytes Relative: 9 %
Lymphs Abs: 1.5 10*3/uL (ref 0.7–4.0)
MCH: 24.9 pg — ABNORMAL LOW (ref 26.0–34.0)
MCHC: 29.1 g/dL — ABNORMAL LOW (ref 30.0–36.0)
MCV: 85.4 fL (ref 80.0–100.0)
Monocytes Absolute: 0.7 10*3/uL (ref 0.1–1.0)
Monocytes Relative: 4 %
Neutro Abs: 13.9 10*3/uL — ABNORMAL HIGH (ref 1.7–7.7)
Neutrophils Relative %: 84 %
Platelets: 487 10*3/uL — ABNORMAL HIGH (ref 150–400)
RBC: 3.78 MIL/uL — ABNORMAL LOW (ref 3.87–5.11)
RDW: 17.1 % — ABNORMAL HIGH (ref 11.5–15.5)
WBC: 16.6 10*3/uL — ABNORMAL HIGH (ref 4.0–10.5)
nRBC: 0.8 % — ABNORMAL HIGH (ref 0.0–0.2)

## 2020-09-03 LAB — COMPREHENSIVE METABOLIC PANEL
ALT: 30 U/L (ref 0–44)
AST: 22 U/L (ref 15–41)
Albumin: 2.7 g/dL — ABNORMAL LOW (ref 3.5–5.0)
Alkaline Phosphatase: 57 U/L (ref 38–126)
Anion gap: 10 (ref 5–15)
BUN: 17 mg/dL (ref 6–20)
CO2: 26 mmol/L (ref 22–32)
Calcium: 8.9 mg/dL (ref 8.9–10.3)
Chloride: 106 mmol/L (ref 98–111)
Creatinine, Ser: 0.75 mg/dL (ref 0.44–1.00)
GFR calc Af Amer: >60 mL/min (ref >60)
GFR calc non Af Amer: >60 mL/min (ref >60)
Glucose, Bld: 149 mg/dL — ABNORMAL HIGH (ref 70–99)
Potassium: 4 mmol/L (ref 3.5–5.1)
Sodium: 142 mmol/L (ref 135–145)
Total Bilirubin: 0.3 mg/dL (ref 0.3–1.2)
Total Protein: 6.6 g/dL (ref 6.5–8.1)

## 2020-09-03 LAB — C-REACTIVE PROTEIN: CRP: 5.7 mg/dL — ABNORMAL HIGH (ref <1.0)

## 2020-09-03 LAB — FERRITIN: Ferritin: 27 ng/mL (ref 11–307)

## 2020-09-03 LAB — MAGNESIUM: Magnesium: 2 mg/dL (ref 1.7–2.4)

## 2020-09-03 LAB — PHOSPHORUS: Phosphorus: 4.7 mg/dL — ABNORMAL HIGH (ref 2.5–4.6)

## 2020-09-03 LAB — D-DIMER, QUANTITATIVE: D-Dimer, Quant: 3.86 ug/mL-FEU — ABNORMAL HIGH (ref 0.00–0.50)

## 2020-09-03 MED ORDER — DEXAMETHASONE 6 MG PO TABS: 6.0000 mg | ORAL_TABLET | Freq: Every day | ORAL | 0 refills | Status: AC

## 2020-09-03 NOTE — Progress Notes (Signed)
Pt is being discharged home today. Discharge instructions including medications and follow up appts given. Pt has no further questions at this time.

## 2020-09-03 NOTE — Discharge Summary (Signed)
Physician Discharge Summary  Rachel Wilson QMV:[id number] DOB: 05-28-1971 DOA: 08/30/2020  PCP: Patient, No Pcp Per  Admit date: 08/30/2020 Discharge date: 09/03/2020  Admitted From: Home Disposition: Home  Recommendations for Outpatient Follow-up:  1. Follow up with PCP in 1-2 weeks   Home Health: Not applicable Equipment/Devices: Oxygen 2 L/min  Discharge Condition: Stable CODE STATUS: Full code Diet recommendation: Regular diet  Discharge summary: 49 year old female with no significant history, history of migraine headache, not vaccinated against COVID-19 presented to the ER for progressive cough and shortness of breath.  In the emergency room temperature 101, tachycardic with heart rate 140, 82% on room air, x-ray with multifocal pneumonia, D-dimer 12.26, CTA negative for PE.  Admitted to the hospital due to significant symptoms.  Patient was admitted to hospital and treated for acute hypoxemic respiratory failure due to COVID-19 pneumonia with COVID-19 directed therapy with high-dose Solu-Medrol and remdesivir that she received for 5 days.  CTA was negative on 9/12 for pulmonary embolism.  Duplexes were negative for presence of DVT on 9/14.  With supportive treatment and steroids as well as remdesivir, patient did good clinical recovery.  Most of the symptoms have improved now.  She is still requiring about 2 L of oxygen on mobility to keep up safe saturation limits of 88 to 89%.  She is going home today with supplemental oxygen.  Will complete total 10 days of steroids.  We will continue other supportive treatments of over-the-counter medications.  Discharge Diagnoses:  Principal Problem:   Pneumonia due to COVID-19 virus Active Problems:   Acute respiratory failure due to COVID-19 [facility])   Migraine headache   Hypokalemia    Discharge Instructions  Discharge Instructions    Call MD for:  difficulty breathing, headache or visual disturbances   Complete by: As directed    Call  MD for:  extreme fatigue   Complete by: As directed    Call MD for:  temperature >100.4   Complete by: As directed    Diet - low sodium heart healthy   Complete by: As directed    Diet general   Complete by: As directed    Discharge instructions   Complete by: As directed    You can use over-the-counter cough medications and Tylenol as needed. Total isolation duration 3 weeks from your positive test. Recommend taking COVID-19 vaccination when you improved from current symptoms.   Increase activity slowly   Complete by: As directed    Increase activity slowly   Complete by: As directed      Allergies as of 09/03/2020   No Known Allergies     Medication List    TAKE these medications   acetaminophen 500 MG tablet Commonly known as: TYLENOL Take 1,000 mg by mouth every 6 (six) hours as needed for headache (pain).   albuterol 108 (90 Base) MCG/ACT inhaler Commonly known as: VENTOLIN HFA Inhale 2 puffs into the lungs every 6 (six) hours as needed for wheezing or shortness of breath.   chlorpheniramine-HYDROcodone 10-8 MG/5ML Suer Commonly known as: TUSSIONEX Take 5 mLs by mouth every 12 (twelve) hours as needed for cough.   dexamethasone 6 MG tablet Commonly known as: Decadron Take 1 tablet (6 mg total) by mouth daily for 5 days.   MAGNESIUM PO Take 1 tablet by mouth daily.   multivitamin with minerals Tabs tablet Take 1 tablet by mouth daily.   rizatriptan 10 MG tablet Commonly known as: MAXALT Take 10 mg by mouth 2 (two) times  daily as needed for migraine.   tacrolimus 0.1 % ointment Commonly known as: PROTOPIC Apply 1 application topically 2 (two) times daily as needed (facial breakouts).   ZINC PO Take 1 tablet by mouth daily.            Durable Medical Equipment  (From admission, onward)         Start     Ordered   09/03/20 0932  For home use only DME oxygen  Once       Comments: Patient Saturations on Room Air at Rest = 92%  Patient  Saturations on Room Air while Ambulating = 85%  Patient Saturations on 2 Liters of oxygen while Ambulating = 90%  Question Answer Comment  Length of Need 6 Months   Mode or (Route) Nasal cannula   Liters per Minute 2   Frequency Continuous (stationary and portable oxygen unit needed)   Oxygen conserving device Yes   Oxygen delivery system Gas      09/03/20 0931          Follow-up Information    Real Cons Dionne, PA Follow up in 1 week(s).   Specialty: Family Medicine Contact information: [address] [place] [address] [phone]              No Known Allergies  Consultations:  None   Procedures/Studies: CT Angio Chest PE W and/or Wo Contrast  Result Date: 08/30/2020 CLINICAL DATA:  COVID, short of breath tachycardia EXAM: CT ANGIOGRAPHY CHEST WITH CONTRAST TECHNIQUE: Multidetector CT imaging of the chest was performed using the standard protocol during bolus administration of intravenous contrast. Multiplanar CT image reconstructions and MIPs were obtained to evaluate the vascular anatomy. CONTRAST:  OMNIPAQUE IOHEXOL 350 MG/ML SOLN COMPARISON:  None. FINDINGS: Cardiovascular: Satisfactory opacification of the pulmonary arteries to the segmental level. No evidence of pulmonary embolism. Normal heart size. No pericardial effusion. Nonaneurysmal aorta. No dissection is seen. Mediastinum/Nodes: Midline trachea. No suspicious thyroid mass. Mild mediastinal adenopathy. Left jugular node measures 11 mm. Prevascular lymph node measuring up to 15 mm. Left precarinal node measures 10 mm. Small hilar nodes. Esophagus within normal limits. Lungs/Pleura: Widespread bilateral ground-glass densities and patchy consolidations. No pleural effusion or pneumothorax Upper Abdomen: No acute abnormality. Musculoskeletal: No chest wall abnormality. No acute or significant osseous findings. Review of the MIP images confirms the above findings. IMPRESSION: 1. Negative  for acute pulmonary embolus or aortic dissection. 2. Widespread bilateral ground-glass densities and patchy consolidations, consistent with bilateral pneumonia and likely related to history of COVID positivity. 3. Mild mediastinal and hilar adenopathy, likely reactive. Electronically Signed   By: Jasmine Pang M.D.   On: 08/30/2020 21:22   DG Chest Port 1 View  Result Date: 08/30/2020 CLINICAL DATA:  COVID positive with shortness of breath. EXAM: PORTABLE CHEST 1 VIEW COMPARISON:  None. FINDINGS: Mild ill-defined multifocal infiltrates are seen within the bilateral lung bases and along the periphery of both lungs. There is no evidence of a pleural effusion or pneumothorax. The heart size and mediastinal contours are within normal limits. The visualized skeletal structures are unremarkable. IMPRESSION: Mild bilateral multifocal infiltrates. Electronically Signed   By: Aram Candela M.D.   On: 08/30/2020 20:27   VAS [place] LOWER EXTREMITY VENOUS (DVT)  Result Date: 09/01/2020  Lower Venous DVTStudy Indications: Elevated d-dimer.  Anticoagulation: Lovenox. Comparison Study: No prior studies. Performing Technologist: Jean Rosenthal  Examination Guidelines: A complete evaluation includes B-mode imaging, spectral Doppler, color Doppler, and power  Doppler as needed of all accessible portions of each vessel. Bilateral testing is considered an integral part of a complete examination. Limited examinations for reoccurring indications may be performed as noted. The reflux portion of the exam is performed with the patient in reverse Trendelenburg.  +---------+---------------+---------+-----------+----------+--------------+ RIGHT    CompressibilityPhasicitySpontaneityPropertiesThrombus Aging +---------+---------------+---------+-----------+----------+--------------+ CFV      Full           Yes      Yes                                 +---------+---------------+---------+-----------+----------+--------------+  SFJ      Full                                                        +---------+---------------+---------+-----------+----------+--------------+ FV Prox  Full                                                        +---------+---------------+---------+-----------+----------+--------------+ FV Mid   Full                                                        +---------+---------------+---------+-----------+----------+--------------+ FV DistalFull                                                        +---------+---------------+---------+-----------+----------+--------------+ PFV      Full                                                        +---------+---------------+---------+-----------+----------+--------------+ POP      Full           Yes      Yes                                 +---------+---------------+---------+-----------+----------+--------------+ PTV      Full                                                        +---------+---------------+---------+-----------+----------+--------------+ PERO     Full                                                        +---------+---------------+---------+-----------+----------+--------------+   +---------+---------------+---------+-----------+----------+--------------+  LEFT     CompressibilityPhasicitySpontaneityPropertiesThrombus Aging +---------+---------------+---------+-----------+----------+--------------+ CFV      Full           Yes      Yes                                 +---------+---------------+---------+-----------+----------+--------------+ SFJ      Full                                                        +---------+---------------+---------+-----------+----------+--------------+ FV Prox  Full                                                        +---------+---------------+---------+-----------+----------+--------------+ FV Mid   Full                                                         +---------+---------------+---------+-----------+----------+--------------+ FV DistalFull                                                        +---------+---------------+---------+-----------+----------+--------------+ PFV      Full                                                        +---------+---------------+---------+-----------+----------+--------------+ POP      Full           Yes      Yes                                 +---------+---------------+---------+-----------+----------+--------------+ PTV      Full                                                        +---------+---------------+---------+-----------+----------+--------------+ PERO     Full                                                        +---------+---------------+---------+-----------+----------+--------------+     Summary: RIGHT: - There is no evidence of deep vein thrombosis in the lower extremity.  - No cystic structure found in the popliteal fossa.  LEFT: - There is no evidence of deep vein thrombosis in the lower extremity.  - No  cystic structure found in the popliteal fossa.  *See table(s) above for measurements and observations. Electronically signed by Gretta Began MD on 09/01/2020 at 4:58:53 PM.    Final     (Echo, Carotid, EGD, Colonoscopy, ERCP)    Subjective: Patient seen and examined.  No overnight events.  Feels okay at rest.  Some difficulty after walking many laps in the hospital.  Eager to go home.   Discharge Exam: Vitals:   09/02/20 1950 09/03/20 0504  BP: (!) 161/101 (!) 151/96  Pulse: 96 74  Resp: (!) 22 20  Temp: 98.6 F (37 C) 98.7 F (37.1 C)  SpO2: 97% 96%   Vitals:   09/02/20 0406 09/02/20 1457 09/02/20 1950 09/03/20 0504  BP: (!) 143/91 (!) 173/112 (!) 161/101 (!) 151/96  Pulse: 76 96 96 74  Resp: 20 19 (!) 22 20  Temp: 98 F (36.7 C) 98.6 F (37 C) 98.6 F (37 C) 98.7 F (37.1 C)  TempSrc:      SpO2: 99% 94% 97% 96%   Weight:      Height:        General: Pt is alert, awake, not in acute distress Walking around in the hallway with 2 L of oxygen.  Looks comfortable. Cardiovascular: RRR, S1/S2 +, no rubs, no gallops Respiratory: CTA bilaterally, no wheezing, no rhonchi Abdominal: Soft, NT, ND, bowel sounds + Extremities: no edema, no cyanosis    The results of significant diagnostics from this hospitalization (including imaging, microbiology, ancillary and laboratory) are listed below for reference.     Microbiology: Recent Results (from the past 240 hour(s))  Blood Culture (routine x 2)     Status: None (Preliminary result)   Collection Time: 08/30/20  7:44 PM   Specimen: BLOOD  Result Value Ref Range Status   Specimen Description   Final    BLOOD RIGHT ANTECUBITAL Performed at [facility], [address], [place], [place] [address]    Special Requests   Final    BOTTLES DRAWN AEROBIC AND ANAEROBIC Blood Culture adequate volume Performed at [facility], [address] [place], [place] [address]    Culture   Final    NO GROWTH 2 DAYS Performed at [facility], [address], [place], [place] [address]    Report Status PENDING  Incomplete  SARS Coronavirus 2 by RT PCR (hospital order, performed in [facility] hospital lab) Nasopharyngeal Nasopharyngeal Swab     Status: Abnormal   Collection Time: 08/30/20  8:02 PM   Specimen: Nasopharyngeal Swab  Result Value Ref Range Status   SARS Coronavirus 2 POSITIVE (A) NEGATIVE Final    Comment: RESULT CALLED TO, READ BACK BY AND VERIFIED WITH: Garnette Czech, RN AT 2127 ON [id number] BY BOWLBY, J (NOTE) SARS-CoV-2 target nucleic acids are DETECTED  SARS-CoV-2 RNA is generally detectable in upper respiratory specimens  during the acute phase of infection.  Positive results are indicative  of the presence of the identified virus, but do not rule out bacterial infection or co-infection with other pathogens not detected by  the test.  Clinical correlation with patient history and  other diagnostic information is necessary to determine patient infection status.  The expected result is negative.  Fact Sheet for Patients:   BoilerBrush.com.cy   Fact Sheet for Healthcare Providers:   https://pope.com/    This test is not yet approved or cleared by the [place] FDA and  has been authorized for detection and/or diagnosis  of SARS-CoV-2 by FDA under an Emergency Use Authorization (EUA).  This EUA will remain in effect (me aning this test can be used) for the duration of  the COVID-19 declaration under Section 564(b)(1) of the Act, 21 U.S.C. section 360-bbb-3(b)(1), unless the authorization is terminated or revoked sooner.  Performed at [facility], [address] [place], [place] [address]   Blood Culture (routine x 2)     Status: None (Preliminary result)   Collection Time: 08/30/20  8:05 PM   Specimen: BLOOD RIGHT HAND  Result Value Ref Range Status   Specimen Description   Final    BLOOD RIGHT HAND Performed at [facility], [address] [place], [place] [address]    Special Requests   Final    BOTTLES DRAWN AEROBIC ONLY Blood Culture results may not be optimal due to an inadequate volume of blood received in culture bottles Performed at [facility], [address] [place], [place] [address]    Culture   Final    NO GROWTH 2 DAYS Performed at [facility], [address] [place], [place] [address]    Report Status PENDING  Incomplete     Labs: BNP (last 3 results) No results for input(s): BNP in the last 8760 hours. Basic Metabolic Panel: Recent Labs  Lab 08/30/20 1944 09/01/20 0546 09/02/20 0428 09/03/20 0351  NA 136 140 139 142  K 3.3* 4.0 4.0 4.0  CL 93* 100 105 106  CO2 29 24 25 26   GLUCOSE 115* 123* 144* 149*  BUN 6 16 19 17   CREATININE 0.78 0.67 0.78 0.75  CALCIUM 8.5* 8.9 8.8* 8.9  MG   --  2.5* 2.0 2.0  PHOS  --  2.9 4.2 4.7*   Liver Function Tests: Recent Labs  Lab 08/30/20 1944 09/01/20 0546 09/02/20 0428 09/03/20 0351  AST 46* 27 26 22   ALT 38 29 33 30  ALKPHOS 60 55 54 57  BILITOT 0.9 0.3 0.6 0.3  PROT 7.7 6.6 6.8 6.6  ALBUMIN 2.8* 2.6* 2.7* 2.7*   No results for input(s): LIPASE, AMYLASE in the last 168 hours. No results for input(s): AMMONIA in the last 168 hours. CBC: Recent Labs  Lab 08/30/20 1944 09/01/20 0546 09/02/20 0428 09/03/20 0351  WBC 7.9 14.0* 17.2* 16.6*  NEUTROABS 6.6 11.8* 14.7* 13.9*  HGB 9.8* 9.1* 9.2* 9.4*  HCT 32.3* 31.1* 30.7* 32.3*  MCV 83.2 86.9 86.2 85.4  PLT 419* 386 465* 487*   Cardiac Enzymes: No results for input(s): CKTOTAL, CKMB, CKMBINDEX, TROPONINI in the last 168 hours. BNP: Invalid input(s): POCBNP CBG: No results for input(s): GLUCAP in the last 168 hours. D-Dimer Recent Labs    09/02/20 0428 09/03/20 0351  DDIMER 8.16* 3.86*   Hgb A1c No results for input(s): HGBA1C in the last 72 hours. Lipid Profile No results for input(s): CHOL, HDL, LDLCALC, TRIG, CHOLHDL, LDLDIRECT in the last 72 hours. Thyroid function studies No results for input(s): TSH, T4TOTAL, T3FREE, THYROIDAB in the last 72 hours.  Invalid input(s): FREET3 Anemia work up 09/05/20    09/02/20 0428 09/03/20 0351  FERRITIN 33 27   Urinalysis No results found for: COLORURINE, APPEARANCEUR, LABSPEC, PHURINE, GLUCOSEU, HGBUR, BILIRUBINUR, KETONESUR, PROTEINUR, UROBILINOGEN, NITRITE, LEUKOCYTESUR Sepsis Labs Invalid input(s): PROCALCITONIN,  WBC,  LACTICIDVEN Microbiology Recent Results (from the past 240 hour(s))  Blood Culture (routine x 2)     Status: None (Preliminary result)   Collection Time: 08/30/20  7:44 PM   Specimen: BLOOD  Result Value Ref Range Status   Specimen Description   Final    BLOOD RIGHT ANTECUBITAL Performed at [facility], [address] [place], [place] [address]    Special Requests   Final     BOTTLES DRAWN AEROBIC AND ANAEROBIC Blood Culture adequate volume Performed at [facility], [address] [place], [place] [address]    Culture   Final    NO GROWTH 2 DAYS Performed at [facility], [address] [place], [place] [address]    Report Status PENDING  Incomplete  SARS Coronavirus 2 by RT PCR (hospital order, performed in [facility] hospital lab) Nasopharyngeal Nasopharyngeal Swab     Status: Abnormal   Collection Time: 08/30/20  8:02 PM   Specimen: Nasopharyngeal Swab  Result Value Ref Range Status   SARS Coronavirus 2 POSITIVE (A) NEGATIVE Final    Comment: RESULT CALLED TO, READ BACK BY AND VERIFIED WITH: Garnette Czech, RN AT 2127 ON [id number] BY BOWLBY, J (NOTE) SARS-CoV-2 target nucleic acids are DETECTED  SARS-CoV-2 RNA is generally detectable in upper respiratory specimens  during the acute phase of infection.  Positive results are indicative  of the presence of the identified virus, but do not rule out bacterial infection or co-infection with other pathogens not detected by the test.  Clinical correlation with patient history and  other diagnostic information is necessary to determine patient infection status.  The expected result is negative.  Fact Sheet for Patients:   BoilerBrush.com.cy   Fact Sheet for Healthcare Providers:   https://pope.com/    This test is not yet approved or cleared by the [place] FDA and  has been authorized for detection and/or diagnosis of SARS-CoV-2 by FDA under an Emergency Use Authorization (EUA).  This EUA will remain in effect (me aning this test can be used) for the duration of  the COVID-19 declaration under Section 564(b)(1) of the Act, 21 U.S.C. section 360-bbb-3(b)(1), unless the authorization is terminated or revoked sooner.  Performed at [facility], [address] [place], [place] [address]   Blood Culture (routine x 2)      Status: None (Preliminary result)   Collection Time: 08/30/20  8:05 PM   Specimen: BLOOD RIGHT HAND  Result Value Ref Range Status   Specimen Description   Final    BLOOD RIGHT HAND Performed at [facility], [address], [place], [place] [address]    Special Requests   Final    BOTTLES DRAWN AEROBIC ONLY Blood Culture results may not be optimal due to an inadequate volume of blood received in culture bottles Performed at [facility], [address] [place], [place] [address]    Culture   Final    NO GROWTH 2 DAYS Performed at [facility], [address], [place], [place] [address]    Report Status PENDING  Incomplete     Time coordinating discharge:  40 minutes  SIGNED:   Dorcas Carrow, MD  [facility] 09/03/2020, 11:48 AM

## 2020-09-03 NOTE — Progress Notes (Signed)
SATURATION QUALIFICATIONS: (This note is used to comply with regulatory documentation for home oxygen)  Patient Saturations on Room Air at Rest = 92%  Patient Saturations on Room Air while Ambulating = 85%  Patient Saturations on 2 Liters of oxygen while Ambulating = 90%  Please briefly explain why patient needs home oxygen: Pt desats <88% on RA with activity.

## 2020-09-05 LAB — CULTURE, BLOOD (ROUTINE X 2)
Culture: NO GROWTH
Culture: NO GROWTH
Special Requests: ADEQUATE

## 2021-05-31 IMAGING — CT CT ANGIO CHEST
2 of 11 series · 17 of 36 positions shown · IV contrast (Omnipaque)
Comparison: None.

CLINICAL DATA: COVID, short of breath tachycardia

EXAM:
CT ANGIOGRAPHY CHEST WITH CONTRAST
TECHNIQUE: Multidetector CT imaging of the chest was performed using the
standard protocol during bolus administration of intravenous
contrast. Multiplanar CT image reconstructions and MIPs were
obtained to evaluate the vascular anatomy.
CONTRAST:  100mL OMNIPAQUE IOHEXOL 350 MG/ML SOLN

[Series 9: pe coronal mpr · coronal · 0.52mm/px · 1 of 138 slices shown]
[im 69/138  mediastinal]
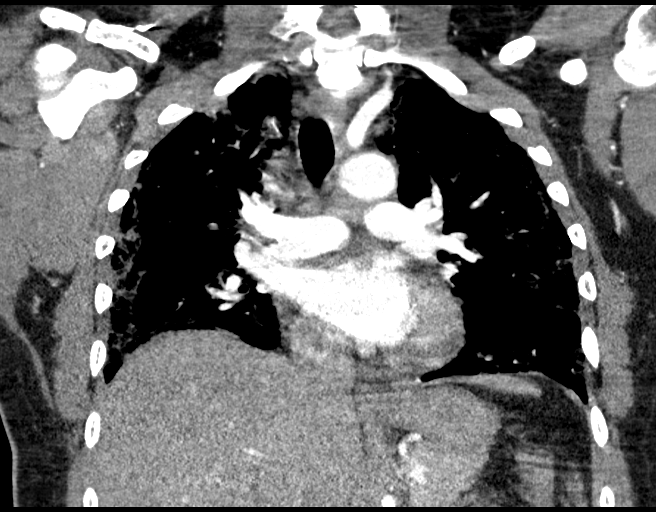

[Series 13: pe thins · axial · 0.69mm/px · z∈[-9,+221]mm · 16 of 262 slices shown]
[im 16/262  lung]
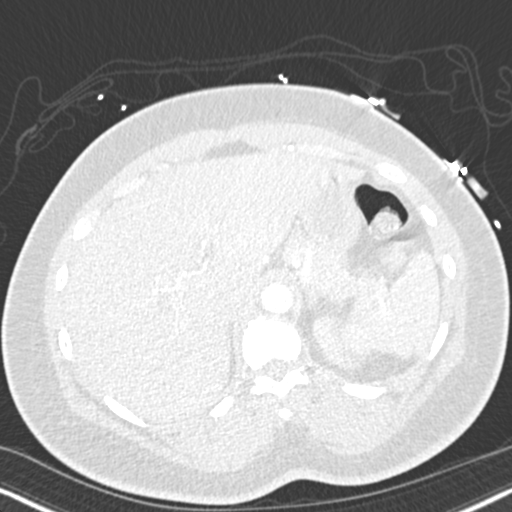
[im 31/262  mediastinal]
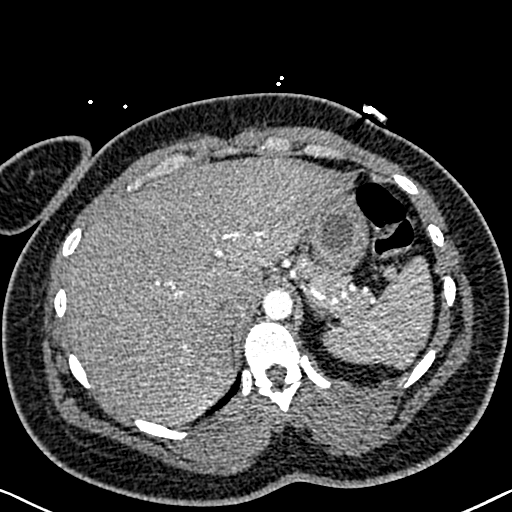
[im 47/262  lung]
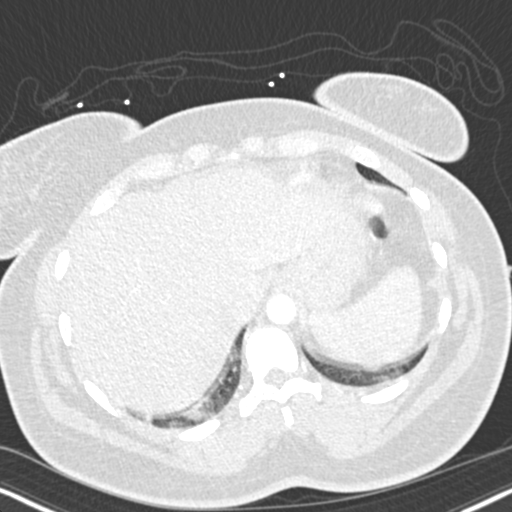
[im 62/262  mediastinal]
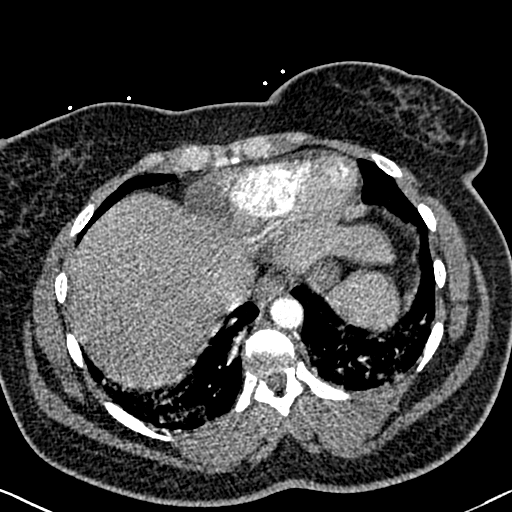
[im 77/262  lung]
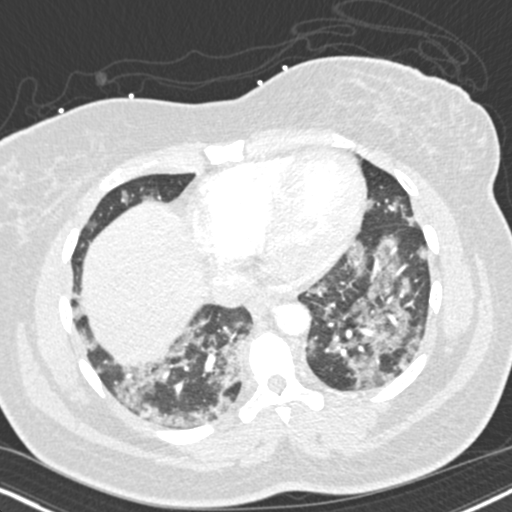
[im 93/262  mediastinal]
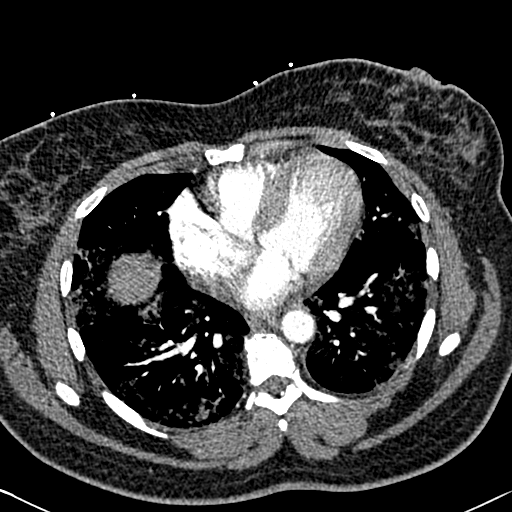
[im 108/262  lung]
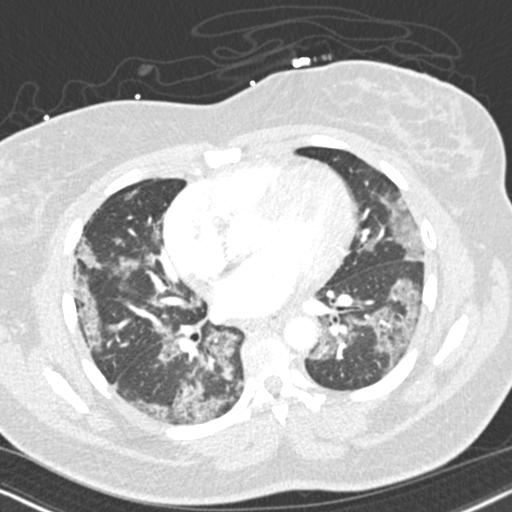
[im 123/262  mediastinal]
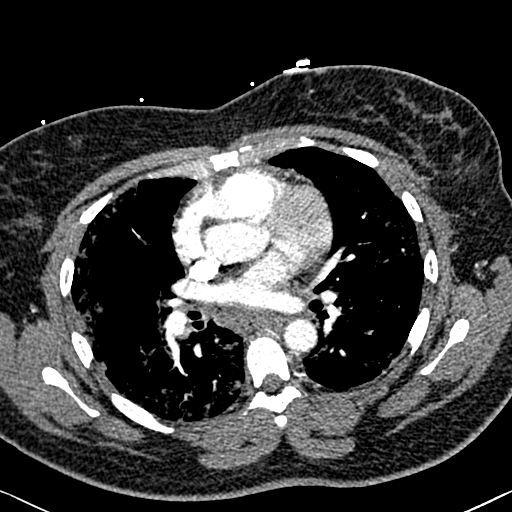
[im 139/262  lung]
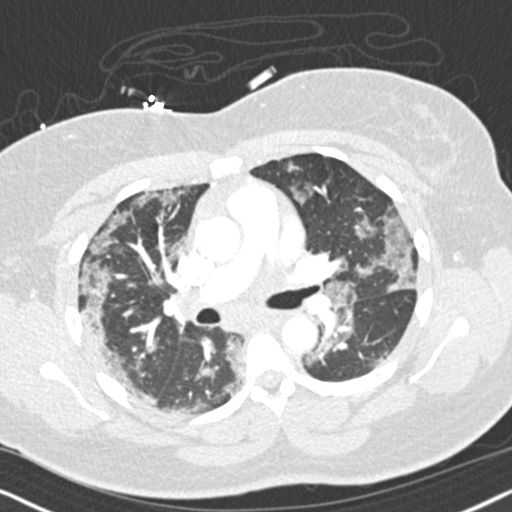
[im 154/262  mediastinal]
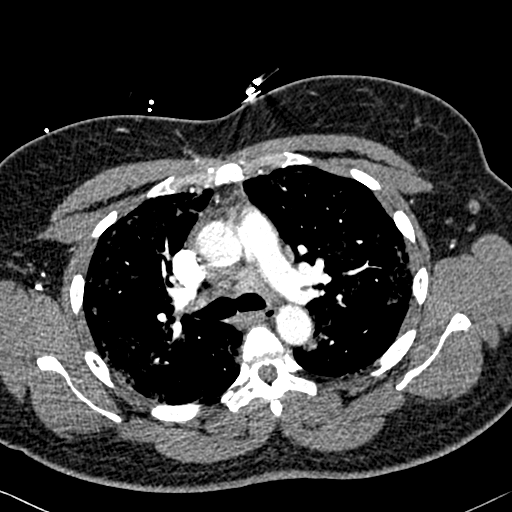
[im 169/262  lung]
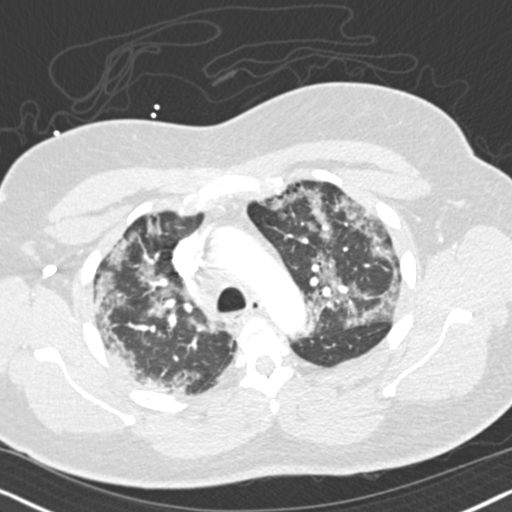
[im 185/262  mediastinal]
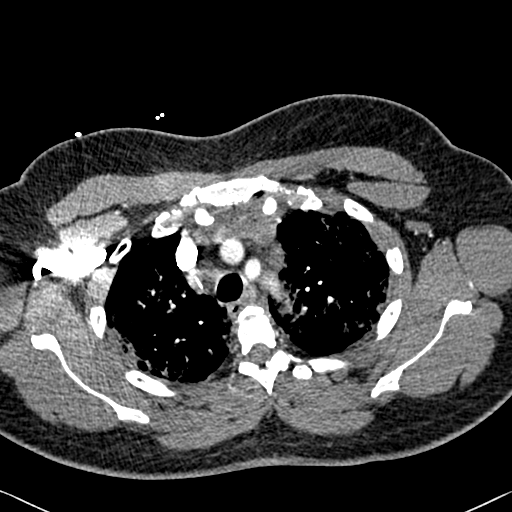
[im 200/262  lung]
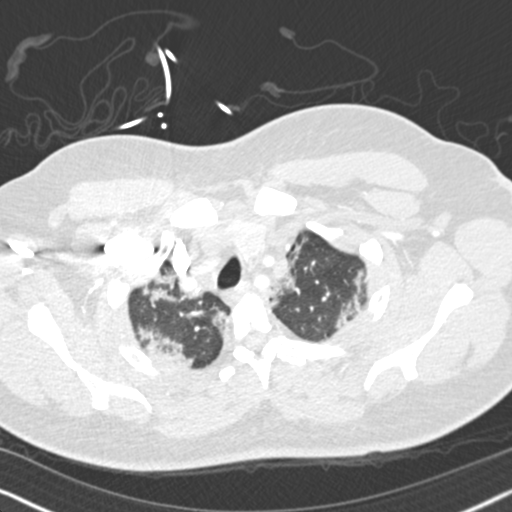
[im 215/262  mediastinal]
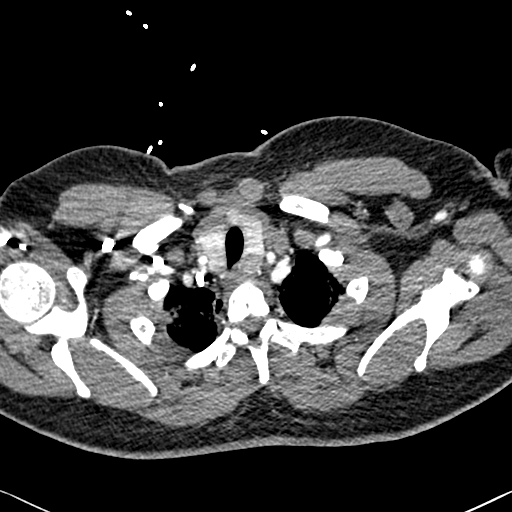
[im 231/262  lung]
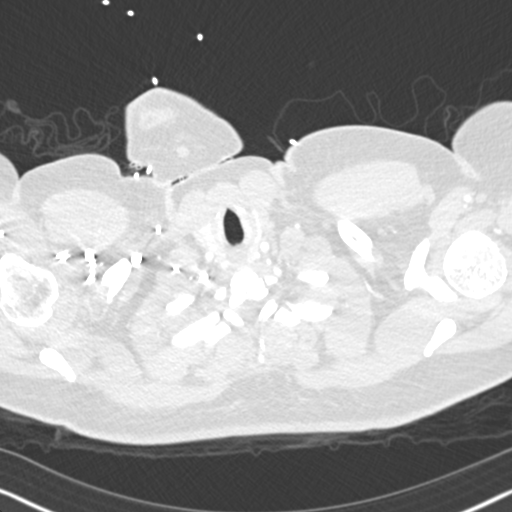
[im 246/262  mediastinal]
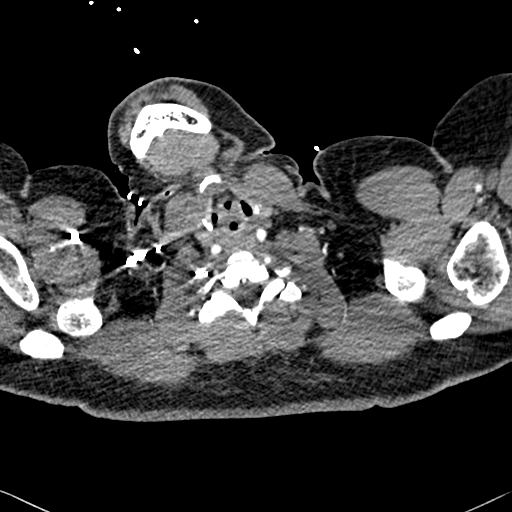

[17 of 36 positions shown; findings below may reference images not displayed]

FINDINGS: Cardiovascular: Satisfactory opacification of the pulmonary arteries
to the segmental level. No evidence of pulmonary embolism. Normal
heart size. No pericardial effusion. Nonaneurysmal aorta. No
dissection is seen.

Mediastinum/Nodes: Midline trachea. No suspicious thyroid mass. Mild
mediastinal adenopathy. Left jugular node measures 11 mm.
Prevascular lymph node measuring up to 15 mm. Left precarinal node
measures 10 mm. Small hilar nodes. Esophagus within normal limits.

Lungs/Pleura: Widespread bilateral ground-glass densities and patchy
consolidations. No pleural effusion or pneumothorax

Upper Abdomen: No acute abnormality.

Musculoskeletal: No chest wall abnormality. No acute or significant
osseous findings.

Review of the MIP images confirms the above findings.
IMPRESSION: 1. Negative for acute pulmonary embolus or aortic dissection.
2. Widespread bilateral ground-glass densities and patchy
consolidations, consistent with bilateral pneumonia and likely
related to history of COVID positivity.
3. Mild mediastinal and hilar adenopathy, likely reactive.
